# Patient Record
Sex: Female | Born: 1935 | Race: White | Hispanic: No | Smoking: Former smoker
Health system: Southern US, Community
[De-identification: ages and names within clinical notes are randomized; demographics above are authoritative.]

## PROBLEM LIST (undated history)

## (undated) DIAGNOSIS — M8580 Other specified disorders of bone density and structure, unspecified site: Secondary | ICD-10-CM

## (undated) DIAGNOSIS — F329 Major depressive disorder, single episode, unspecified: Secondary | ICD-10-CM

## (undated) DIAGNOSIS — F0390 Unspecified dementia without behavioral disturbance: Secondary | ICD-10-CM

## (undated) DIAGNOSIS — F32A Depression, unspecified: Secondary | ICD-10-CM

## (undated) DIAGNOSIS — E039 Hypothyroidism, unspecified: Secondary | ICD-10-CM

## (undated) DIAGNOSIS — C50919 Malignant neoplasm of unspecified site of unspecified female breast: Secondary | ICD-10-CM

## (undated) DIAGNOSIS — I10 Essential (primary) hypertension: Secondary | ICD-10-CM

## (undated) DIAGNOSIS — R519 Headache, unspecified: Secondary | ICD-10-CM

## (undated) DIAGNOSIS — I639 Cerebral infarction, unspecified: Secondary | ICD-10-CM

## (undated) DIAGNOSIS — J189 Pneumonia, unspecified organism: Secondary | ICD-10-CM

## (undated) DIAGNOSIS — E785 Hyperlipidemia, unspecified: Secondary | ICD-10-CM

## (undated) DIAGNOSIS — C44300 Unspecified malignant neoplasm of skin of unspecified part of face: Secondary | ICD-10-CM

## (undated) DIAGNOSIS — I739 Peripheral vascular disease, unspecified: Secondary | ICD-10-CM

## (undated) DIAGNOSIS — N289 Disorder of kidney and ureter, unspecified: Secondary | ICD-10-CM

## (undated) DIAGNOSIS — R0602 Shortness of breath: Secondary | ICD-10-CM

## (undated) DIAGNOSIS — Z8719 Personal history of other diseases of the digestive system: Secondary | ICD-10-CM

## (undated) DIAGNOSIS — J449 Chronic obstructive pulmonary disease, unspecified: Secondary | ICD-10-CM

## (undated) DIAGNOSIS — M1990 Unspecified osteoarthritis, unspecified site: Secondary | ICD-10-CM

## (undated) DIAGNOSIS — N184 Chronic kidney disease, stage 4 (severe): Secondary | ICD-10-CM

## (undated) DIAGNOSIS — F419 Anxiety disorder, unspecified: Secondary | ICD-10-CM

## (undated) DIAGNOSIS — R51 Headache: Secondary | ICD-10-CM

## (undated) HISTORY — DX: Chronic obstructive pulmonary disease, unspecified: J44.9

## (undated) HISTORY — DX: Major depressive disorder, single episode, unspecified: F32.9

## (undated) HISTORY — DX: Disorder of kidney and ureter, unspecified: N28.9

## (undated) HISTORY — PX: TONSILLECTOMY: SUR1361

## (undated) HISTORY — DX: Hypothyroidism, unspecified: E03.9

## (undated) HISTORY — DX: Unspecified osteoarthritis, unspecified site: M19.90

## (undated) HISTORY — DX: Other specified disorders of bone density and structure, unspecified site: M85.80

## (undated) HISTORY — PX: ABDOMINAL HYSTERECTOMY: SHX81

## (undated) HISTORY — DX: Malignant neoplasm of unspecified site of unspecified female breast: C50.919

## (undated) HISTORY — DX: Essential (primary) hypertension: I10

## (undated) HISTORY — DX: Hyperlipidemia, unspecified: E78.5

## (undated) HISTORY — DX: Depression, unspecified: F32.A

## (undated) HISTORY — DX: Unspecified dementia, unspecified severity, without behavioral disturbance, psychotic disturbance, mood disturbance, and anxiety: F03.90

## (undated) HISTORY — PX: BREAST LUMPECTOMY: SHX2

## (undated) HISTORY — PX: APPENDECTOMY: SHX54

---

## 1989-04-09 DIAGNOSIS — Z8711 Personal history of peptic ulcer disease: Secondary | ICD-10-CM

## 1989-04-09 HISTORY — PX: REFRACTIVE SURGERY: SHX103

## 1989-04-09 HISTORY — DX: Personal history of peptic ulcer disease: Z87.11

## 1989-04-09 HISTORY — PX: SKIN CANCER EXCISION: SHX779

## 1989-04-09 HISTORY — PX: CATARACT EXTRACTION W/ INTRAOCULAR LENS  IMPLANT, BILATERAL: SHX1307

## 1999-04-10 HISTORY — PX: BILATERAL OOPHORECTOMY: SHX1221

## 2003-01-16 ENCOUNTER — Ambulatory Visit (HOSPITAL_COMMUNITY): Admission: RE | Admit: 2003-01-16 | Discharge: 2003-01-16 | Payer: Self-pay | Admitting: Nephrology

## 2004-07-09 ENCOUNTER — Emergency Department (HOSPITAL_COMMUNITY): Admission: EM | Admit: 2004-07-09 | Discharge: 2004-07-10 | Payer: Self-pay | Admitting: Emergency Medicine

## 2004-12-21 ENCOUNTER — Emergency Department (HOSPITAL_COMMUNITY): Admission: EM | Admit: 2004-12-21 | Discharge: 2004-12-21 | Payer: Self-pay | Admitting: Emergency Medicine

## 2005-01-12 ENCOUNTER — Emergency Department (HOSPITAL_COMMUNITY): Admission: EM | Admit: 2005-01-12 | Discharge: 2005-01-12 | Payer: Self-pay | Admitting: Emergency Medicine

## 2005-06-15 ENCOUNTER — Encounter: Admission: RE | Admit: 2005-06-15 | Discharge: 2005-06-15 | Payer: Self-pay | Admitting: General Surgery

## 2005-06-24 ENCOUNTER — Ambulatory Visit (HOSPITAL_COMMUNITY): Admission: RE | Admit: 2005-06-24 | Discharge: 2005-06-25 | Payer: Self-pay | Admitting: General Surgery

## 2005-06-24 ENCOUNTER — Encounter (INDEPENDENT_AMBULATORY_CARE_PROVIDER_SITE_OTHER): Payer: Self-pay | Admitting: *Deleted

## 2005-07-13 ENCOUNTER — Ambulatory Visit: Payer: Self-pay | Admitting: Oncology

## 2005-08-03 ENCOUNTER — Ambulatory Visit (HOSPITAL_COMMUNITY): Admission: RE | Admit: 2005-08-03 | Discharge: 2005-08-03 | Payer: Self-pay | Admitting: Oncology

## 2005-08-05 ENCOUNTER — Ambulatory Visit: Admission: RE | Admit: 2005-08-05 | Discharge: 2005-09-16 | Payer: Self-pay | Admitting: Radiation Oncology

## 2005-08-05 ENCOUNTER — Encounter: Admission: RE | Admit: 2005-08-05 | Discharge: 2005-08-05 | Payer: Self-pay | Admitting: Oncology

## 2005-09-09 ENCOUNTER — Ambulatory Visit: Payer: Self-pay | Admitting: Oncology

## 2005-12-08 ENCOUNTER — Ambulatory Visit: Payer: Self-pay | Admitting: Oncology

## 2005-12-10 LAB — COMPREHENSIVE METABOLIC PANEL
Albumin: 3.8 g/dL (ref 3.5–5.2)
Alkaline Phosphatase: 75 U/L (ref 39–117)
CO2: 27 mEq/L (ref 19–32)
Chloride: 105 mEq/L (ref 96–112)
Glucose, Bld: 74 mg/dL (ref 70–99)
Potassium: 4.2 mEq/L (ref 3.5–5.3)
Sodium: 141 mEq/L (ref 135–145)
Total Protein: 6.5 g/dL (ref 6.0–8.3)

## 2005-12-10 LAB — CBC WITH DIFFERENTIAL/PLATELET
Eosinophils Absolute: 0.6 10*3/uL — ABNORMAL HIGH (ref 0.0–0.5)
MONO#: 1 10*3/uL — ABNORMAL HIGH (ref 0.1–0.9)
NEUT#: 4.4 10*3/uL (ref 1.5–6.5)
RBC: 4.67 10*6/uL (ref 3.70–5.32)
RDW: 15.7 % — ABNORMAL HIGH (ref 11.3–14.5)
WBC: 8.2 10*3/uL (ref 3.9–10.0)
lymph#: 2.1 10*3/uL (ref 0.9–3.3)

## 2006-03-23 ENCOUNTER — Ambulatory Visit: Payer: Self-pay | Admitting: Family Medicine

## 2006-03-30 ENCOUNTER — Ambulatory Visit: Payer: Self-pay | Admitting: Family Medicine

## 2006-03-31 ENCOUNTER — Encounter: Admission: RE | Admit: 2006-03-31 | Discharge: 2006-03-31 | Payer: Self-pay | Admitting: Family Medicine

## 2006-04-06 ENCOUNTER — Ambulatory Visit: Payer: Self-pay | Admitting: Oncology

## 2006-05-03 ENCOUNTER — Ambulatory Visit: Payer: Self-pay | Admitting: Family Medicine

## 2006-09-07 DIAGNOSIS — J449 Chronic obstructive pulmonary disease, unspecified: Secondary | ICD-10-CM | POA: Insufficient documentation

## 2006-09-07 DIAGNOSIS — J4489 Other specified chronic obstructive pulmonary disease: Secondary | ICD-10-CM | POA: Insufficient documentation

## 2006-09-07 DIAGNOSIS — I10 Essential (primary) hypertension: Secondary | ICD-10-CM | POA: Insufficient documentation

## 2006-09-07 DIAGNOSIS — F068 Other specified mental disorders due to known physiological condition: Secondary | ICD-10-CM | POA: Insufficient documentation

## 2006-09-07 DIAGNOSIS — E785 Hyperlipidemia, unspecified: Secondary | ICD-10-CM

## 2006-09-15 ENCOUNTER — Ambulatory Visit: Payer: Self-pay | Admitting: Internal Medicine

## 2006-09-15 LAB — CONVERTED CEMR LAB
BUN: 31 mg/dL — ABNORMAL HIGH (ref 6–23)
Basophils Relative: 1 % (ref 0.0–1.0)
CO2: 26 meq/L (ref 19–32)
Calcium: 9.8 mg/dL (ref 8.4–10.5)
Eosinophils Absolute: 0.5 10*3/uL (ref 0.0–0.6)
GFR calc Af Amer: 35 mL/min
GFR calc non Af Amer: 29 mL/min
Lymphocytes Relative: 19.5 % (ref 12.0–46.0)
Monocytes Relative: 10 % (ref 3.0–11.0)
Neutro Abs: 6.4 10*3/uL (ref 1.4–7.7)
Platelets: 220 10*3/uL (ref 150–400)

## 2006-09-30 ENCOUNTER — Ambulatory Visit: Payer: Self-pay | Admitting: Internal Medicine

## 2006-10-11 ENCOUNTER — Encounter: Payer: Self-pay | Admitting: Internal Medicine

## 2006-11-08 ENCOUNTER — Ambulatory Visit: Payer: Self-pay | Admitting: Oncology

## 2006-11-11 LAB — COMPREHENSIVE METABOLIC PANEL
ALT: 13 U/L (ref 0–35)
Albumin: 4.1 g/dL (ref 3.5–5.2)
CO2: 26 mEq/L (ref 19–32)
Glucose, Bld: 74 mg/dL (ref 70–99)
Potassium: 4.4 mEq/L (ref 3.5–5.3)
Sodium: 143 mEq/L (ref 135–145)
Total Bilirubin: 0.4 mg/dL (ref 0.3–1.2)
Total Protein: 6.9 g/dL (ref 6.0–8.3)

## 2006-11-11 LAB — CANCER ANTIGEN 27.29: CA 27.29: 39 U/mL (ref 0–39)

## 2006-11-11 LAB — CBC WITH DIFFERENTIAL/PLATELET
BASO%: 1 % (ref 0.0–2.0)
Eosinophils Absolute: 0.5 10*3/uL (ref 0.0–0.5)
LYMPH%: 19.5 % (ref 14.0–48.0)
MCHC: 34.7 g/dL (ref 32.0–36.0)
MONO#: 1 10*3/uL — ABNORMAL HIGH (ref 0.1–0.9)
NEUT#: 5.5 10*3/uL (ref 1.5–6.5)
RBC: 4.6 10*6/uL (ref 3.70–5.32)
RDW: 15.3 % — ABNORMAL HIGH (ref 11.3–14.5)
WBC: 8.8 10*3/uL (ref 3.9–10.0)
lymph#: 1.7 10*3/uL (ref 0.9–3.3)

## 2006-11-11 LAB — LACTATE DEHYDROGENASE: LDH: 156 U/L (ref 94–250)

## 2007-02-03 ENCOUNTER — Encounter (INDEPENDENT_AMBULATORY_CARE_PROVIDER_SITE_OTHER): Payer: Self-pay | Admitting: Family Medicine

## 2008-12-27 ENCOUNTER — Encounter: Payer: Self-pay | Admitting: Internal Medicine

## 2008-12-30 ENCOUNTER — Ambulatory Visit: Payer: Self-pay | Admitting: Internal Medicine

## 2008-12-30 DIAGNOSIS — N259 Disorder resulting from impaired renal tubular function, unspecified: Secondary | ICD-10-CM | POA: Insufficient documentation

## 2008-12-30 DIAGNOSIS — E0590 Thyrotoxicosis, unspecified without thyrotoxic crisis or storm: Secondary | ICD-10-CM | POA: Insufficient documentation

## 2008-12-30 DIAGNOSIS — Z853 Personal history of malignant neoplasm of breast: Secondary | ICD-10-CM

## 2009-01-01 ENCOUNTER — Encounter: Payer: Self-pay | Admitting: Internal Medicine

## 2009-01-07 LAB — CONVERTED CEMR LAB
ALT: 16 units/L (ref 0–35)
Eosinophils Relative: 4.3 % (ref 0.0–5.0)
GFR calc non Af Amer: 28.47 mL/min (ref >60)
HCT: 41 % (ref 36.0–46.0)
HDL: 41.6 mg/dL (ref >39.00)
Hemoglobin: 14.3 g/dL (ref 12.0–15.0)
Lymphs Abs: 2.1 10*3/uL (ref 0.7–4.0)
Monocytes Relative: 9.7 % (ref 3.0–12.0)
Neutro Abs: 5.9 10*3/uL (ref 1.4–7.7)
Potassium: 4 meq/L (ref 3.5–5.1)
RBC: 4.69 M/uL (ref 3.87–5.11)
Sodium: 144 meq/L (ref 135–145)
TSH: 0.8 microintl units/mL (ref 0.35–5.50)
Total CHOL/HDL Ratio: 6
Triglycerides: 121 mg/dL (ref 0.0–149.0)
WBC: 9.3 10*3/uL (ref 4.5–10.5)

## 2009-01-08 ENCOUNTER — Encounter: Payer: Self-pay | Admitting: Internal Medicine

## 2009-01-09 ENCOUNTER — Encounter: Payer: Self-pay | Admitting: Internal Medicine

## 2009-01-13 ENCOUNTER — Encounter (INDEPENDENT_AMBULATORY_CARE_PROVIDER_SITE_OTHER): Payer: Self-pay | Admitting: *Deleted

## 2009-01-13 ENCOUNTER — Encounter: Payer: Self-pay | Admitting: Internal Medicine

## 2009-01-15 ENCOUNTER — Encounter: Payer: Self-pay | Admitting: Internal Medicine

## 2009-01-16 ENCOUNTER — Encounter: Payer: Self-pay | Admitting: Internal Medicine

## 2009-01-29 ENCOUNTER — Encounter: Payer: Self-pay | Admitting: Internal Medicine

## 2009-02-04 ENCOUNTER — Encounter: Payer: Self-pay | Admitting: Internal Medicine

## 2009-02-18 ENCOUNTER — Ambulatory Visit: Payer: Self-pay | Admitting: Internal Medicine

## 2009-02-18 DIAGNOSIS — M1990 Unspecified osteoarthritis, unspecified site: Secondary | ICD-10-CM | POA: Insufficient documentation

## 2009-02-18 DIAGNOSIS — M899 Disorder of bone, unspecified: Secondary | ICD-10-CM | POA: Insufficient documentation

## 2009-02-18 DIAGNOSIS — M949 Disorder of cartilage, unspecified: Secondary | ICD-10-CM

## 2009-02-18 DIAGNOSIS — R269 Unspecified abnormalities of gait and mobility: Secondary | ICD-10-CM

## 2009-02-18 LAB — CONVERTED CEMR LAB
Ketones, urine, test strip: NEGATIVE
Nitrite: NEGATIVE
Urobilinogen, UA: 0.2
pH: 5

## 2009-02-20 ENCOUNTER — Encounter: Payer: Self-pay | Admitting: Internal Medicine

## 2009-02-21 ENCOUNTER — Encounter: Payer: Self-pay | Admitting: Internal Medicine

## 2009-02-24 ENCOUNTER — Encounter: Admission: RE | Admit: 2009-02-24 | Discharge: 2009-04-10 | Payer: Self-pay | Admitting: Internal Medicine

## 2009-02-24 ENCOUNTER — Ambulatory Visit: Payer: Self-pay | Admitting: Internal Medicine

## 2009-02-24 ENCOUNTER — Telehealth (INDEPENDENT_AMBULATORY_CARE_PROVIDER_SITE_OTHER): Payer: Self-pay | Admitting: *Deleted

## 2009-02-24 ENCOUNTER — Encounter: Payer: Self-pay | Admitting: Family Medicine

## 2009-02-25 ENCOUNTER — Telehealth (INDEPENDENT_AMBULATORY_CARE_PROVIDER_SITE_OTHER): Payer: Self-pay | Admitting: *Deleted

## 2009-03-05 ENCOUNTER — Encounter: Payer: Self-pay | Admitting: Internal Medicine

## 2009-03-12 ENCOUNTER — Telehealth: Payer: Self-pay | Admitting: Internal Medicine

## 2009-03-17 ENCOUNTER — Telehealth: Payer: Self-pay | Admitting: Internal Medicine

## 2009-03-28 ENCOUNTER — Encounter: Payer: Self-pay | Admitting: Internal Medicine

## 2009-04-02 ENCOUNTER — Telehealth: Payer: Self-pay | Admitting: Internal Medicine

## 2009-04-02 ENCOUNTER — Encounter: Payer: Self-pay | Admitting: Internal Medicine

## 2009-04-16 ENCOUNTER — Telehealth: Payer: Self-pay | Admitting: Internal Medicine

## 2009-04-17 ENCOUNTER — Ambulatory Visit: Payer: Self-pay | Admitting: Internal Medicine

## 2009-04-18 ENCOUNTER — Telehealth: Payer: Self-pay | Admitting: Internal Medicine

## 2009-04-21 ENCOUNTER — Encounter: Payer: Self-pay | Admitting: Internal Medicine

## 2009-04-23 LAB — CONVERTED CEMR LAB: TSH: 0.79 microintl units/mL (ref 0.35–5.50)

## 2009-04-29 ENCOUNTER — Encounter: Payer: Self-pay | Admitting: Internal Medicine

## 2009-05-20 ENCOUNTER — Telehealth: Payer: Self-pay | Admitting: Internal Medicine

## 2009-05-21 ENCOUNTER — Encounter: Payer: Self-pay | Admitting: Internal Medicine

## 2009-06-20 ENCOUNTER — Ambulatory Visit: Payer: Self-pay | Admitting: Family Medicine

## 2009-06-26 ENCOUNTER — Encounter: Payer: Self-pay | Admitting: Family Medicine

## 2009-06-27 ENCOUNTER — Ambulatory Visit: Payer: Self-pay | Admitting: Family Medicine

## 2009-06-27 ENCOUNTER — Telehealth: Payer: Self-pay | Admitting: Internal Medicine

## 2009-06-27 DIAGNOSIS — N390 Urinary tract infection, site not specified: Secondary | ICD-10-CM

## 2009-06-27 LAB — CONVERTED CEMR LAB
Bilirubin Urine: NEGATIVE
Hemoglobin: 13.2 g/dL
Ketones, urine, test strip: NEGATIVE
Nitrite: POSITIVE
Specific Gravity, Urine: 1.015
pH: 6

## 2009-07-01 ENCOUNTER — Telehealth (INDEPENDENT_AMBULATORY_CARE_PROVIDER_SITE_OTHER): Payer: Self-pay | Admitting: *Deleted

## 2009-07-17 ENCOUNTER — Emergency Department (HOSPITAL_BASED_OUTPATIENT_CLINIC_OR_DEPARTMENT_OTHER): Admission: EM | Admit: 2009-07-17 | Discharge: 2009-07-17 | Payer: Self-pay | Admitting: Emergency Medicine

## 2009-07-17 ENCOUNTER — Telehealth: Payer: Self-pay | Admitting: Internal Medicine

## 2009-07-17 ENCOUNTER — Ambulatory Visit: Payer: Self-pay | Admitting: Diagnostic Radiology

## 2009-07-18 ENCOUNTER — Encounter (INDEPENDENT_AMBULATORY_CARE_PROVIDER_SITE_OTHER): Payer: Self-pay | Admitting: *Deleted

## 2009-07-23 ENCOUNTER — Telehealth: Payer: Self-pay | Admitting: Internal Medicine

## 2009-07-29 ENCOUNTER — Encounter: Admission: RE | Admit: 2009-07-29 | Discharge: 2009-07-29 | Payer: Self-pay | Admitting: Internal Medicine

## 2009-07-31 ENCOUNTER — Telehealth (INDEPENDENT_AMBULATORY_CARE_PROVIDER_SITE_OTHER): Payer: Self-pay | Admitting: *Deleted

## 2009-08-06 ENCOUNTER — Encounter (INDEPENDENT_AMBULATORY_CARE_PROVIDER_SITE_OTHER): Payer: Self-pay | Admitting: *Deleted

## 2009-08-06 LAB — CONVERTED CEMR LAB
Bilirubin Urine: NEGATIVE
Glucose, Urine, Semiquant: NEGATIVE
Protein, U semiquant: NEGATIVE
Specific Gravity, Urine: 1.01
pH: 5

## 2009-08-07 ENCOUNTER — Encounter: Payer: Self-pay | Admitting: Internal Medicine

## 2009-08-07 LAB — CONVERTED CEMR LAB
Casts: NONE SEEN /lpf
Crystals: NONE SEEN
Squamous Epithelial / LPF: NONE SEEN /lpf

## 2009-08-11 ENCOUNTER — Emergency Department (HOSPITAL_BASED_OUTPATIENT_CLINIC_OR_DEPARTMENT_OTHER): Admission: EM | Admit: 2009-08-11 | Discharge: 2009-08-11 | Payer: Self-pay | Admitting: Emergency Medicine

## 2009-08-11 ENCOUNTER — Telehealth: Payer: Self-pay | Admitting: Internal Medicine

## 2009-08-11 ENCOUNTER — Encounter: Payer: Self-pay | Admitting: Internal Medicine

## 2009-08-11 ENCOUNTER — Ambulatory Visit: Payer: Self-pay | Admitting: Diagnostic Radiology

## 2009-08-13 ENCOUNTER — Encounter: Payer: Self-pay | Admitting: Internal Medicine

## 2009-08-22 ENCOUNTER — Ambulatory Visit: Payer: Self-pay | Admitting: Internal Medicine

## 2009-08-25 ENCOUNTER — Ambulatory Visit: Payer: Self-pay | Admitting: Internal Medicine

## 2009-08-26 ENCOUNTER — Encounter: Payer: Self-pay | Admitting: Internal Medicine

## 2009-08-26 LAB — CONVERTED CEMR LAB
Bacteria, UA: NONE SEEN
Casts: NONE SEEN /lpf
RBC / HPF: NONE SEEN (ref <3)

## 2009-08-28 ENCOUNTER — Telehealth: Payer: Self-pay | Admitting: Internal Medicine

## 2009-09-19 ENCOUNTER — Encounter: Payer: Self-pay | Admitting: Internal Medicine

## 2009-09-22 ENCOUNTER — Telehealth: Payer: Self-pay | Admitting: Internal Medicine

## 2009-09-24 ENCOUNTER — Ambulatory Visit: Payer: Self-pay | Admitting: Internal Medicine

## 2009-09-25 ENCOUNTER — Encounter: Payer: Self-pay | Admitting: Internal Medicine

## 2009-10-06 ENCOUNTER — Encounter: Payer: Self-pay | Admitting: Internal Medicine

## 2009-10-24 ENCOUNTER — Encounter: Admission: RE | Admit: 2009-10-24 | Discharge: 2009-10-24 | Payer: Self-pay | Admitting: Neurology

## 2009-10-28 ENCOUNTER — Telehealth: Payer: Self-pay | Admitting: Internal Medicine

## 2009-11-14 ENCOUNTER — Telehealth: Payer: Self-pay | Admitting: Internal Medicine

## 2009-11-17 ENCOUNTER — Encounter: Payer: Self-pay | Admitting: Internal Medicine

## 2009-11-24 ENCOUNTER — Encounter: Payer: Self-pay | Admitting: Internal Medicine

## 2009-12-22 ENCOUNTER — Encounter: Payer: Self-pay | Admitting: Internal Medicine

## 2009-12-24 ENCOUNTER — Encounter: Payer: Self-pay | Admitting: Internal Medicine

## 2010-01-02 ENCOUNTER — Encounter: Payer: Self-pay | Admitting: Internal Medicine

## 2010-01-15 ENCOUNTER — Ambulatory Visit: Payer: Self-pay | Admitting: Internal Medicine

## 2010-01-15 DIAGNOSIS — L989 Disorder of the skin and subcutaneous tissue, unspecified: Secondary | ICD-10-CM | POA: Insufficient documentation

## 2010-01-15 LAB — CONVERTED CEMR LAB
Basophils Absolute: 0.1 10*3/uL (ref 0.0–0.1)
Bilirubin Urine: NEGATIVE
Calcium: 10 mg/dL (ref 8.4–10.5)
Creatinine, Ser: 1.7 mg/dL — ABNORMAL HIGH (ref 0.4–1.2)
Eosinophils Absolute: 1 10*3/uL — ABNORMAL HIGH (ref 0.0–0.7)
GFR calc non Af Amer: 29.92 mL/min (ref >60)
Glucose, Urine, Semiquant: NEGATIVE
Hemoglobin: 14.1 g/dL (ref 12.0–15.0)
Lymphocytes Relative: 16.9 % (ref 12.0–46.0)
MCHC: 34.2 g/dL (ref 30.0–36.0)
Monocytes Relative: 10.7 % (ref 3.0–12.0)
Neutro Abs: 6.4 10*3/uL (ref 1.4–7.7)
Neutrophils Relative %: 61.9 % (ref 43.0–77.0)
RBC: 4.73 M/uL (ref 3.87–5.11)
RDW: 15.1 % — ABNORMAL HIGH (ref 11.5–14.6)
Sodium: 143 meq/L (ref 135–145)
Specific Gravity, Urine: 1.01
Vit D, 25-Hydroxy: 26 ng/mL — ABNORMAL LOW (ref 30–89)
pH: 6

## 2010-01-16 ENCOUNTER — Encounter: Payer: Self-pay | Admitting: Internal Medicine

## 2010-01-18 ENCOUNTER — Telehealth (INDEPENDENT_AMBULATORY_CARE_PROVIDER_SITE_OTHER): Payer: Self-pay | Admitting: *Deleted

## 2010-01-26 ENCOUNTER — Encounter: Payer: Self-pay | Admitting: Internal Medicine

## 2010-02-12 ENCOUNTER — Encounter: Payer: Self-pay | Admitting: Internal Medicine

## 2010-02-26 ENCOUNTER — Encounter: Payer: Self-pay | Admitting: Internal Medicine

## 2010-03-11 ENCOUNTER — Encounter: Payer: Self-pay | Admitting: Internal Medicine

## 2010-03-12 ENCOUNTER — Encounter: Payer: Self-pay | Admitting: Internal Medicine

## 2010-03-12 ENCOUNTER — Telehealth (INDEPENDENT_AMBULATORY_CARE_PROVIDER_SITE_OTHER): Payer: Self-pay | Admitting: *Deleted

## 2010-04-03 ENCOUNTER — Ambulatory Visit: Payer: Self-pay | Admitting: Internal Medicine

## 2010-04-03 LAB — CONVERTED CEMR LAB
Bilirubin Urine: NEGATIVE
Glucose, Urine, Semiquant: NEGATIVE
Protein, U semiquant: 30
Specific Gravity, Urine: 1.02
pH: 5

## 2010-04-04 ENCOUNTER — Encounter: Payer: Self-pay | Admitting: Internal Medicine

## 2010-04-04 LAB — CONVERTED CEMR LAB
Casts: NONE SEEN /lpf
Crystals: NONE SEEN

## 2010-04-06 ENCOUNTER — Telehealth: Payer: Self-pay | Admitting: Internal Medicine

## 2010-04-06 LAB — CONVERTED CEMR LAB
Basophils Absolute: 0.1 10*3/uL (ref 0.0–0.1)
Eosinophils Relative: 10 % — ABNORMAL HIGH (ref 0–5)
Lymphocytes Relative: 21 % (ref 12–46)
Lymphs Abs: 2.6 10*3/uL (ref 0.7–4.0)
Neutro Abs: 7.1 10*3/uL (ref 1.7–7.7)
Neutrophils Relative %: 58 % (ref 43–77)
Platelets: 188 10*3/uL (ref 150–400)
RDW: 15.9 % — ABNORMAL HIGH (ref 11.5–15.5)
TSH: 6.478 microintl units/mL — ABNORMAL HIGH (ref 0.350–4.500)
WBC: 12.3 10*3/uL — ABNORMAL HIGH (ref 4.0–10.5)

## 2010-04-10 ENCOUNTER — Encounter: Payer: Self-pay | Admitting: Internal Medicine

## 2010-04-14 ENCOUNTER — Encounter: Payer: Self-pay | Admitting: Internal Medicine

## 2010-04-17 ENCOUNTER — Encounter: Payer: Self-pay | Admitting: Internal Medicine

## 2010-04-19 ENCOUNTER — Encounter: Payer: Self-pay | Admitting: Internal Medicine

## 2010-04-21 ENCOUNTER — Ambulatory Visit: Payer: Self-pay | Admitting: Diagnostic Radiology

## 2010-04-21 ENCOUNTER — Emergency Department (HOSPITAL_BASED_OUTPATIENT_CLINIC_OR_DEPARTMENT_OTHER): Admission: EM | Admit: 2010-04-21 | Discharge: 2010-04-21 | Payer: Self-pay | Admitting: Emergency Medicine

## 2010-04-21 ENCOUNTER — Telehealth: Payer: Self-pay | Admitting: Internal Medicine

## 2010-04-22 ENCOUNTER — Ambulatory Visit: Payer: Self-pay | Admitting: Internal Medicine

## 2010-04-22 DIAGNOSIS — F329 Major depressive disorder, single episode, unspecified: Secondary | ICD-10-CM

## 2010-04-24 ENCOUNTER — Encounter: Payer: Self-pay | Admitting: Internal Medicine

## 2010-04-24 ENCOUNTER — Telehealth (INDEPENDENT_AMBULATORY_CARE_PROVIDER_SITE_OTHER): Payer: Self-pay | Admitting: *Deleted

## 2010-05-05 ENCOUNTER — Ambulatory Visit: Payer: Self-pay | Admitting: Internal Medicine

## 2010-05-07 ENCOUNTER — Encounter: Payer: Self-pay | Admitting: Internal Medicine

## 2010-05-11 ENCOUNTER — Encounter: Payer: Self-pay | Admitting: Internal Medicine

## 2010-05-11 LAB — CONVERTED CEMR LAB: TSH: 1.36 microintl units/mL (ref 0.35–5.50)

## 2010-05-14 ENCOUNTER — Telehealth: Payer: Self-pay | Admitting: Internal Medicine

## 2010-05-15 ENCOUNTER — Encounter: Payer: Self-pay | Admitting: Internal Medicine

## 2010-05-15 ENCOUNTER — Telehealth (INDEPENDENT_AMBULATORY_CARE_PROVIDER_SITE_OTHER): Payer: Self-pay | Admitting: *Deleted

## 2010-05-19 ENCOUNTER — Encounter: Payer: Self-pay | Admitting: Internal Medicine

## 2010-05-20 ENCOUNTER — Telehealth: Payer: Self-pay | Admitting: Internal Medicine

## 2010-05-21 ENCOUNTER — Telehealth (INDEPENDENT_AMBULATORY_CARE_PROVIDER_SITE_OTHER): Payer: Self-pay | Admitting: *Deleted

## 2010-05-21 ENCOUNTER — Telehealth: Payer: Self-pay | Admitting: Internal Medicine

## 2010-05-22 ENCOUNTER — Encounter: Payer: Self-pay | Admitting: Internal Medicine

## 2010-05-26 ENCOUNTER — Encounter: Payer: Self-pay | Admitting: Internal Medicine

## 2010-06-02 ENCOUNTER — Encounter: Payer: Self-pay | Admitting: Internal Medicine

## 2010-06-03 ENCOUNTER — Telehealth: Payer: Self-pay | Admitting: Internal Medicine

## 2010-06-08 ENCOUNTER — Encounter: Payer: Self-pay | Admitting: Internal Medicine

## 2010-06-10 ENCOUNTER — Telehealth (INDEPENDENT_AMBULATORY_CARE_PROVIDER_SITE_OTHER): Payer: Self-pay | Admitting: *Deleted

## 2010-06-15 ENCOUNTER — Encounter: Payer: Self-pay | Admitting: Internal Medicine

## 2010-06-28 ENCOUNTER — Encounter: Payer: Self-pay | Admitting: Internal Medicine

## 2010-07-28 ENCOUNTER — Telehealth: Payer: Self-pay | Admitting: Internal Medicine

## 2010-08-07 ENCOUNTER — Telehealth: Payer: Self-pay | Admitting: Internal Medicine

## 2010-08-11 ENCOUNTER — Ambulatory Visit
Admission: RE | Admit: 2010-08-11 | Discharge: 2010-08-11 | Payer: Self-pay | Source: Home / Self Care | Attending: Internal Medicine | Admitting: Internal Medicine

## 2010-08-13 ENCOUNTER — Inpatient Hospital Stay (HOSPITAL_COMMUNITY)
Admission: EM | Admit: 2010-08-13 | Discharge: 2010-08-17 | Payer: Self-pay | Attending: Internal Medicine | Admitting: Internal Medicine

## 2010-08-13 ENCOUNTER — Emergency Department (HOSPITAL_BASED_OUTPATIENT_CLINIC_OR_DEPARTMENT_OTHER)
Admission: EM | Admit: 2010-08-13 | Discharge: 2010-08-13 | Disposition: A | Discharge status: Other Acute Inpatient Hospital | Payer: Self-pay | Source: Home / Self Care | Admitting: Emergency Medicine

## 2010-08-13 LAB — URINALYSIS, ROUTINE W REFLEX MICROSCOPIC
Bilirubin Urine: NEGATIVE
Ketones, ur: NEGATIVE mg/dL
Nitrite: POSITIVE — AB
Protein, ur: 30 mg/dL — AB
Specific Gravity, Urine: 1.021 (ref 1.005–1.030)
Urine Glucose, Fasting: NEGATIVE mg/dL
Urobilinogen, UA: 0.2 mg/dL (ref 0.0–1.0)
pH: 6 (ref 5.0–8.0)

## 2010-08-13 LAB — BASIC METABOLIC PANEL
BUN: 32 mg/dL — ABNORMAL HIGH (ref 6–23)
CO2: 26 mEq/L (ref 19–32)
Calcium: 9.9 mg/dL (ref 8.4–10.5)
Chloride: 104 mEq/L (ref 96–112)
Creatinine, Ser: 2 mg/dL — ABNORMAL HIGH (ref 0.4–1.2)
GFR calc Af Amer: 29 mL/min — ABNORMAL LOW (ref >60)
GFR calc non Af Amer: 24 mL/min — ABNORMAL LOW (ref >60)
Glucose, Bld: 112 mg/dL — ABNORMAL HIGH (ref 70–99)
Potassium: 3.2 mEq/L — ABNORMAL LOW (ref 3.5–5.1)
Sodium: 145 mEq/L (ref 135–145)

## 2010-08-13 LAB — URINE MICROSCOPIC-ADD ON

## 2010-08-13 LAB — DIFFERENTIAL
Basophils Absolute: 0 10*3/uL (ref 0.0–0.1)
Basophils Relative: 0 % (ref 0–1)
Eosinophils Absolute: 0.7 10*3/uL (ref 0.0–0.7)
Eosinophils Relative: 6 % — ABNORMAL HIGH (ref 0–5)
Lymphocytes Relative: 10 % — ABNORMAL LOW (ref 12–46)
Lymphs Abs: 1.2 10*3/uL (ref 0.7–4.0)
Monocytes Absolute: 0.9 10*3/uL (ref 0.1–1.0)
Monocytes Relative: 8 % (ref 3–12)
Neutro Abs: 9.2 10*3/uL — ABNORMAL HIGH (ref 1.7–7.7)
Neutrophils Relative %: 76 % (ref 43–77)

## 2010-08-13 LAB — CBC
HCT: 43.9 % (ref 36.0–46.0)
Hemoglobin: 14.6 g/dL (ref 12.0–15.0)
MCH: 28 pg (ref 26.0–34.0)
MCHC: 33.3 g/dL (ref 30.0–36.0)
MCV: 84.1 fL (ref 78.0–100.0)
Platelets: 198 10*3/uL (ref 150–400)
RBC: 5.22 MIL/uL — ABNORMAL HIGH (ref 3.87–5.11)
RDW: 14.3 % (ref 11.5–15.5)
WBC: 12.2 10*3/uL — ABNORMAL HIGH (ref 4.0–10.5)

## 2010-08-13 LAB — APTT: aPTT: 26 seconds (ref 24–37)

## 2010-08-13 LAB — PROTIME-INR
INR: 0.99 (ref 0.00–1.49)
Prothrombin Time: 13.3 seconds (ref 11.6–15.2)

## 2010-08-14 ENCOUNTER — Encounter: Payer: Self-pay | Admitting: Internal Medicine

## 2010-08-14 LAB — BASIC METABOLIC PANEL
BUN: 22 mg/dL (ref 6–23)
CO2: 25 mEq/L (ref 19–32)
Calcium: 9.2 mg/dL (ref 8.4–10.5)
Chloride: 106 mEq/L (ref 96–112)
Creatinine, Ser: 1.82 mg/dL — ABNORMAL HIGH (ref 0.4–1.2)
GFR calc Af Amer: 32 mL/min — ABNORMAL LOW (ref >60)
GFR calc non Af Amer: 26 mL/min — ABNORMAL LOW (ref >60)
Glucose, Bld: 89 mg/dL (ref 70–99)
Potassium: 3.4 mEq/L — ABNORMAL LOW (ref 3.5–5.1)
Sodium: 142 mEq/L (ref 135–145)

## 2010-08-14 LAB — CBC
HCT: 37.9 % (ref 36.0–46.0)
Hemoglobin: 12.1 g/dL (ref 12.0–15.0)
MCH: 28.2 pg (ref 26.0–34.0)
MCHC: 31.9 g/dL (ref 30.0–36.0)
MCV: 88.3 fL (ref 78.0–100.0)
Platelets: 187 10*3/uL (ref 150–400)
RBC: 4.29 MIL/uL (ref 3.87–5.11)
RDW: 14.3 % (ref 11.5–15.5)
WBC: 9.9 10*3/uL (ref 4.0–10.5)

## 2010-08-24 LAB — URINE CULTURE
Colony Count: >=100000
Culture  Setup Time: 201201060140

## 2010-08-29 ENCOUNTER — Encounter: Payer: Self-pay | Admitting: Oncology

## 2010-09-08 NOTE — Miscellaneous (Signed)
Summary: Robitussin & Diet Order/[facility]  Robitussin & Diet Order/[facility]   Imported By: Lanelle Bal 04/21/2010 08:15:32  _____________________________________________________________________  External Attachment:    Type:   Image     Comment:   External Document

## 2010-09-08 NOTE — Assessment & Plan Note (Signed)
Summary: F/UP FOR BLADDER INF; CHECK DIZZINESS FROM FALL--OK PER STACI...   Vital Signs:  Patient profile:   77 year old female Weight:      125 pounds Temp:     96.9 degrees F oral Pulse rate:   99 / minute BP sitting:   100 / 60  Vitals Entered By: Kandice Hams (August 22, 2009 4:09 PM) CC: followup from fall, per daughter mother is still dizzy.  Pt unable to void in the lab no specimen left   History of Present Illness: few days ago she tripped on the carpet and fail no LOC , no syncopal spell. she went to the [facility] she was eval and  released she was diagnosed with a UTI as well  hospital notes are reviewed from January 3. CT of the head with no acute changes, Scattered small diploic space lucencies in the skull, stable - myeloma cannot be excluded. pelvic x-rays with no acute changes urine culture was positive for     ENTEROBACTER CLOACAE, was prescribed Kelex,  the urine culture showed that there is resistance to CEFAZOLIN and Microbid  since that time:  she still has dizziness, mild, sx  better if stays quiet Denies dizziness before the fall. over all she is getting stronger    Allergies: 1)  ! Augmentin 2)  ! Sulfa  Past History:  Past Medical History: Reviewed history from 02/18/2009 and no changes required. COPD Hyperlipidemia Hypertension Dementia  Renal insufficiency  (creat 2008 was 1.8) Breast cancer, hx of Hyperthyroidism Osteopenia, last dexa aprox 2008 (per daugher) Osteoarthritis  Past Surgical History: Reviewed history from 12/30/2008 and no changes required. Appendectomy Hysterectomy Tonsillectomy  Social History: Reviewed history from 12/30/2008 and no changes required. moved back to [place] 12-2008 be moving [facility] (ALF)  tobacco-- quit years ago  ETOH-- socially   Review of Systems       denies neck pain or headaches no sore speech noted by the family appetite is good denies fever, dysuria, hematuria  Physical  Exam  General:  alert and well-developed.   Head:  several bruises in the face and forehead Lungs:  Normal respiratory effort, chest expands symmetrically. Lungs are clear to auscultation, no crackles or wheezes. Heart:  normal rate, regular rhythm, and no murmur.   Extremities:  no pretibial edema bilaterally  Neurologic:  external ocular movements are intact speech is fluent memory is at baseline she seems to be doing well. Gait seems also a baseline no gross motor deficits Psych:  not anxious appearing and not depressed appearing.     Impression & Recommendations:  Problem # 1:  DIZZINESS (ICD-780.4) Assessment New data reviewed: see HPI  likely postconcussion recommend observation, see instructions Her updated medication list for this problem includes:    Zyrtec 10 Mg Tabs (Cetirizine hcl) .Marland Kitchen... 1 by mouth at bedtime  Problem # 2:  UTI (ICD-599.0) UTI diagnosed by urine culture 08/08/1999 and again 08/11/2009 if she took Levaquin, she should be okay, see instructions  Problem # 3:  time spent 25 min due to hospital records review   Complete Medication List: 1)  Aricept 10 Mg Tabs (Donepezil hydrochloride) .Marland Kitchen.. 1 by mouth once daily 2)  Femara 2.5 Mg Tabs (Letrozole) .Marland Kitchen.. 1 by mouth once daily 3)  Synthroid 75 Mcg Tabs (Levothyroxine sodium) .Marland Kitchen.. 1 by mouth once daily 4)  Zyrtec 10 Mg Tabs (Cetirizine hcl) .Marland Kitchen.. 1 by mouth at bedtime 5)  Triamterene-hctz 37.5-25 Mg Caps (Triamterene-hctz) .Marland Kitchen.. 1 by mouth  once daily 6)  Bayer Low Strength 81 Mg Tbec (Aspirin) .Marland Kitchen.. 1 by mouth every other day 7)  Centrum Ultra Womens Tabs (Multiple vitamins-minerals) .Marland Kitchen.. 1 by mouth once daily 8)  Hectorol 0.5 Mcg Caps (Doxercalciferol) .Marland Kitchen.. 1 by mouth every m, w, f 9)  Imodium Advanced 2-125 Mg Tabs (Loperamide-simethicone) .Marland Kitchen.. 1 tab after loose stool prn - no more than 3/day 10)  Astepro 0.15 % Soln (Azelastine hcl) .Marland Kitchen.. 1 spray each nostril once daily as needed runny nose 11)  Zoloft 50 Mg  Tabs (Sertraline hcl) .Marland Kitchen.. 1 1/2 by mouth once daily 12)  Tylenol 325 Mg Tabs (Acetaminophen) .... Take two tablets every 4-6 hours as needed for pain 13)  Calcium Carbonate 1250 Mg/2ml Susp (Calcium carbonate) .... 5 ml daily 14)  Lotrisone 1-0.05 % Crea (Clotrimazole-betamethasone) .... Apply two times a day x 10 days to r buttock 15)  Aquacel Hydrofiber 4"x4" Pads (Wound dressings) .... As directed 16)  Premarin 0.625 Mg/gm Crea (Estrogens, conjugated) .... Apply cream to vaginal area m,w,f x3 months  Patient Instructions: 1)  please be sure you took levaquin 250mg  once a day for 10 days 2)  if you didn't , I'll have to prescribe it for you, let me know 3)  If you did, then please get a new urine culture and fax the results to  me 4)  if you are still dizzy in 2 weeks please notify me

## 2010-09-08 NOTE — Assessment & Plan Note (Signed)
Summary: SPOTS ON BACK ARE BLEEDING///PH   Vital Signs:  Patient profile:   77 year old female Height:      63 inches Weight:      132 pounds BMI:     23.47 Pulse rate:   72 / minute Resp:     20 per minute BP sitting:   110 / 58  (left arm)  Vitals Entered By: Jeremy Johann CMA (January 15, 2010 1:16 PM) CC: SPOTS ON PT BACK BLEEDING Comments REVIEWED MED LIST, PATIENT AGREED DOSE AND INSTRUCTION CORRECT    History of Present Illness: here with her daughter complaint of bleeding  spot  in the left upper back. Hypertension-- BP checked from time to time @ the ALF, always ok Dementia -- memory about the same , worse? Renal insufficiency  -to see nephrology soon history of a UTI, daughter  request urine to be checked Hyperthyroidism-- good medication compliance     Allergies: 1)  ! Augmentin 2)  ! Sulfa  Past History:  Past Medical History: Reviewed history from 02/18/2009 and no changes required. COPD Hyperlipidemia Hypertension Dementia  Renal insufficiency  (creat 2008 was 1.8) Breast cancer, hx of Hyperthyroidism Osteopenia, last dexa aprox 2008 (per daugher) Osteoarthritis  Past Surgical History: Reviewed history from 12/30/2008 and no changes required. Appendectomy Hysterectomy Tonsillectomy  Social History: Reviewed history from 09/24/2009 and no changes required. moved back to [place] 12-2008 lives at  [facility] (ALF)  tobacco-- quit years ago  ETOH-- socially   Review of Systems CV:  Denies chest pain or discomfort and swelling of feet. Resp:  Denies cough and shortness of breath. GI:  Denies diarrhea, nausea, and vomiting. Heme:  denies bleeding gums No blood in the stools No blood in the urine.  Physical Exam  General:  alert and well-developed.   Lungs:  Normal respiratory effort, chest expands symmetrically. Lungs are clear to auscultation, no crackles or wheezes. Heart:  normal rate, regular rhythm, and no murmur.   Extremities:  no  edema Skin:  at the upper left back she has a 1.5 cm area of redness, slight dryness and scaliness. Apparently that is the area that is bleeding the most. Inspection of the back showed no other lesions Psych:  not anxious appearing and not depressed appearing.     Impression & Recommendations:  Problem # 1:  SKIN LESION (ICD-709.9)  skin lesion in the back, skin cancer? Refer to dermatology  Orders: Dermatology Referral (Derma)  Problem # 2:  UTI (ICD-599.0) recheck a urine dip today ----> + UCX pending patient asx , will treat base on the culture   Orders: UA Dipstick w/o Micro (manual) (30160) T-Urine Culture (Spectrum Order) [phone])  Problem # 3:  HYPERTHYROIDISM (ICD-242.90) due for labs Labs Reviewed: TSH: 0.79 (04/17/2009)     Orders: Venipuncture (22025) TLB-TSH (Thyroid Stimulating Hormone) (84443-TSH)  Problem # 4:  RENAL INSUFFICIENCY (ICD-588.9) due for labs, we'll send results to nephrology  Orders: TLB-BMP (Basic Metabolic Panel-BMET) (80048-METABOL) TLB-CBC Platelet - w/Differential (85025-CBCD)  Problem # 5:  OSTEOPENIA (ICD-733.90)  Her updated medication list for this problem includes:    Hectorol 0.5 Mcg Caps (Doxercalciferol) .Marland Kitchen... 1 by mouth every m, w, f    Calcium Carbonate 1250 Mg/27ml Susp (Calcium carbonate) .Marland KitchenMarland KitchenMarland KitchenMarland Kitchen 5 ml daily  Orders: T-Vitamin D (25-Hydroxy) (42706-23762)  Complete Medication List: 1)  Aricept 10 Mg Tabs (Donepezil hydrochloride) .Marland Kitchen.. 1 by mouth once daily 2)  Femara 2.5 Mg Tabs (Letrozole) .Marland Kitchen.. 1 by mouth once  daily 3)  Synthroid 75 Mcg Tabs (Levothyroxine sodium) .Marland Kitchen.. 1 by mouth once daily 4)  Zyrtec 10 Mg Tabs (Cetirizine hcl) .Marland Kitchen.. 1 by mouth at bedtime 5)  Triamterene-hctz 37.5-25 Mg Caps (Triamterene-hctz) .Marland Kitchen.. 1 by mouth once daily 6)  Bayer Low Strength 81 Mg Tbec (Aspirin) .Marland Kitchen.. 1 by mouth every other day 7)  Centrum Ultra Womens Tabs (Multiple vitamins-minerals) .Marland Kitchen.. 1 by mouth once daily 8)  Hectorol 0.5  Mcg Caps (Doxercalciferol) .Marland Kitchen.. 1 by mouth every m, w, f 9)  Imodium Advanced 2-125 Mg Tabs (Loperamide-simethicone) .Marland Kitchen.. 1 tab after loose stool prn - no more than 3/day 10)  Astepro 0.15 % Soln (Azelastine hcl) .Marland Kitchen.. 1 spray each nostril once daily as needed runny nose 11)  Zoloft 50 Mg Tabs (Sertraline hcl) .Marland Kitchen.. 1 1/2 by mouth once daily 12)  Tylenol 325 Mg Tabs (Acetaminophen) .... Take two tablets every 4-6 hours as needed for pain 13)  Calcium Carbonate 1250 Mg/26ml Susp (Calcium carbonate) .... 5 ml daily 14)  Aquacel Hydrofiber 4"x4" Pads (Wound dressings) .... As directed  Patient Instructions: 1)  Please schedule a follow-up appointment in 4 to 5 months .   Laboratory Results   Urine Tests    Routine Urinalysis   Color: yellow Appearance: Clear Glucose: negative   (Normal Range: Negative) Bilirubin: negative   (Normal Range: Negative) Ketone: negative   (Normal Range: Negative) Spec. Gravity: 1.010   (Normal Range: 1.003-1.035) Blood: large   (Normal Range: Negative) pH: 6.0   (Normal Range: 5.0-8.0) Protein: trace   (Normal Range: Negative) Urobilinogen: 0.2   (Normal Range: 0-1) Nitrite: positive   (Normal Range: Negative) Leukocyte Esterace: large   (Normal Range: Negative)

## 2010-09-08 NOTE — Miscellaneous (Signed)
Summary: Physician Orders/[facility]  Physician Orders/[facility]   Imported By: Lanelle Bal 10/02/2009 09:12:31  _____________________________________________________________________  External Attachment:    Type:   Image     Comment:   External Document

## 2010-09-08 NOTE — Progress Notes (Signed)
Summary: problem with delivery of antibiotics  Phone Note Outgoing Call   Summary of Call: was prescribed Cipro for 10 days based on the urine culture from 04-04-10 that showed Escherichia coli repeat urine culture few days ago showed AEROCOCCUS VIRIDANS Jose E. Paz MD  May 14, 2010 10:24 AM   Follow-up for Phone Call        [facility] Facility only gave her 3 or 4 days of her antibiotic, then forgot to transfer this order to the September meds list---so she didnt get it from the last day in August until I asked about it around Sept 6-7   then they started it again and she finished the bottle of antibiotics---so she had a split dose----I will call her this afternoon and check on her symptoms Follow-up by: Jerolyn Shin,  May 14, 2010 9:58 AM  Additional Follow-up for Phone Call Additional follow up Details #1::        SPOKE TO HER AT 4:38 PM---SAID SHE WOKE UP THIS MORNING STILL SITTING ON COUCH.  SHE GOT UP AND WENT TO BEDROOM, LAY DOWN ON BED AND FELL ASLEEP UNTIL I WOKE HER AT 4:38.  SHE SAYS SHE FEELS "HORRIBLE"---I WILL GO BY HER PLACE THIS EVENING AFTER WORK.Jerolyn Shin  May 14, 2010 4:43 PM    Additional Follow-up for Phone Call Additional follow up Details #2::    I spoke with pts son in law, he states that she seems to be doing okay. Maralyn Sago went by last night.  Army Fossa CMA,  May 15, 2010 4:14 PM  [facility], if F/C, dysuria will need Re-treatment  Jose E. Paz MD  May 15, 2010 4:48 PM   Additional Follow-up for Phone Call Additional follow up Details #3:: Details for Additional Follow-up Action Taken: spoke with the patient's daughter, she is at baseline Spoke with infectious diseases, they would not treat her for AEROCOCCUS VIRIDANS ( colonization?) Will then simply observe. Will reculture if she becomes symptomatic again Galien E. Paz MD  May 18, 2010 4:57 PM

## 2010-09-08 NOTE — Progress Notes (Signed)
Summary: due for labs  Phone Note Outgoing Call   Summary of Call: advised patient's daughter: Due for a TSH and a urine culture Jose E. Paz MD  March 12, 2010 6:15 PM   Follow-up for Phone Call        DAUGHTER IS ADVISED.Marland KitchenKalyn Negrete  March 13, 2010 2:31 PM

## 2010-09-08 NOTE — Consult Note (Signed)
Summary: [facility]  [facility]   Imported By: Lanelle Bal 10/13/2009 10:02:24  _____________________________________________________________________  External Attachment:    Type:   Image     Comment:   External Document

## 2010-09-08 NOTE — Miscellaneous (Signed)
Summary: Notice of Patient Fall/[facility]  Notice of Patient Fall/[facility]   Imported By: Lanelle Bal 05/29/2010 11:03:33  _____________________________________________________________________  External Attachment:    Type:   Image     Comment:   External Document

## 2010-09-08 NOTE — Progress Notes (Signed)
Summary: Falling/ATB  Phone Note Call from Patient   Caller: Daughter Summary of Call: Pts daughter states that her mothers nurse called her from [facility] and states she fell 3x on Monday 9/12- states she didnt need a nurse. Reported falls when asking for Tylenol Monday night. She states her left lower leg and ankle hurt. [facility] is asking for an order for a mobile x-ray? Or would you rather have her go to X-ray. Which is better? They would also like an order for PT/OT evaluation and Treatment to help with walker and strength. They also did not transfer info on her ATB for UTI from 9/1-9/6. She also had 2-3 days at the end of August of Cipro then nothing. They started again on 9/6. When should she recheck Urine?  Fax orders to [phone] Initial call taken by: Army Fossa CMA,  April 21, 2010 9:07 AM  Follow-up for Phone Call        --okay mobile x-ray, left ankle, dx ankle sprain --needs a total of 10 days of Cipro, urine culture in 4 weeks --okay to refer to PT/ OT Follow-up by: Autumm Hattery E. Veronica Guerrant MD,  April 21, 2010 2:34 PM  Additional Follow-up for Phone Call Additional follow up Details #1::        Spoke with pts daughter, [facility] just ask we fax an order for all of the above.Army Fossa CMA  April 21, 2010 2:44 PM     New/Updated Medications: * 3 DIFFERENT ORDERS FOR PT --okay mobile x-ray, left ankle, dx ankle sprain-845.00 --needs a total of 10 days of Cipro,  --okay to refer to PT/ OT- 781.2 Prescriptions: 3 DIFFERENT ORDERS FOR PT --okay mobile x-ray, left ankle, dx ankle sprain-845.00 --needs a total of 10 days of Cipro,  --okay to refer to PT/ OT- 781.2  #1 x 0   Entered by:   Army Fossa CMA   Authorized by:   Nolon Rod. Mertie Haslem MD   Signed by:   Army Fossa CMA on 04/21/2010   Method used:   Print then Give to Patient   RxID:   [id number]

## 2010-09-08 NOTE — Progress Notes (Signed)
Summary: refill  Phone Note Refill Request Message from:  Fax from Pharmacy on May 15, 2010 4:57 PM  Refills Requested: Medication #1:  ZOLOFT 50 MG TABS 2 by mouth once daily [place] pharmacy - fax [phone]  Initial call taken by: Okey [facility],  May 15, 2010 4:58 PM    Prescriptions: ZOLOFT 50 MG TABS (SERTRALINE HCL) 2 by mouth once daily  #30 x 0   Entered by:   Army Fossa CMA   Authorized by:   Nolon Rod. Paz MD   Signed by:   Army Fossa CMA on 05/18/2010   Method used:   Printed then faxed to ...       [id number] (retail)       [address]       [place], [place]  [address]       Ph: [phone] or [phone]       Fax: [phone]   RxID:   [id number]

## 2010-09-08 NOTE — Assessment & Plan Note (Signed)
Summary: Per daughter needs to see dr/drb   Vital Signs:  Patient profile:   77 year old female Weight:      130.25 pounds Pulse rate:   111 / minute Pulse rhythm:   regular BP sitting:   132 / 84  (left arm) Cuff size:   regular  Vitals Entered By: Army Fossa CMA (April 22, 2010 3:01 PM) CC: [facility] Comments Pt fell yesterday CT and X-rays were done- everything normal.   History of Present Illness: had a fall 04-21-10 She was walking to the dining room, stumbled and fell facedown At the ER, her vital signs were stable X-rays reports are reviewed- CT of the head stable Maxillofacial CT showing no fracture Cervical spine CT negative except for DJD X-rays of the pelvis were negative  the patient states  she simply tripped and fell, no loss of consciousness or syncope. No chest pain or shortness of breath   ROS today, after the fall, she is sore but specifically denies neck pain or hip pain The daughter reports depression I had a long discussion with the patient about this issue, she admits to be somehow depressed but not suicidal. Mostly ,  the  problem is related to the fact that she lives in an assisted living facility and she is not happy about it. At the same time recognizes that for the most part she is treated well. On the other hand she reports that people is "using my coomb and eating my food"  sometimes     Current Medications (verified): 1)  Namenda 5 Mg Tabs (Memantine Hcl) .Marland Kitchen.. 1 By Mouth Two Times A Day 2)  Aricept 10 Mg Tabs (Donepezil Hydrochloride) .Marland Kitchen.. 1 By Mouth Once Daily 3)  Femara 2.5 Mg Tabs (Letrozole) .Marland Kitchen.. 1 By Mouth Once Daily 4)  Synthroid 75 Mcg Tabs (Levothyroxine Sodium) .Marland Kitchen.. 1 By Mouth Daily. 5)  Zyrtec 10 Mg Tabs (Cetirizine Hcl) .Marland Kitchen.. 1 By Mouth At Bedtime 6)  Triamterene-Hctz 37.5-25 Mg Caps (Triamterene-Hctz) .Marland Kitchen.. 1 By Mouth Once Daily 7)  Bayer Low Strength 81 Mg Tbec (Aspirin) .Marland Kitchen.. 1 By Mouth Every Other  Day 8)  Centrum Ultra Womens  Tabs (Multiple Vitamins-Minerals) .Marland Kitchen.. 1 By Mouth Once Daily 9)  Hectorol 0.5 Mcg Caps (Doxercalciferol) .Marland Kitchen.. 1 By Mouth Every M, W, F 10)  Imodium Advanced 2-125 Mg Tabs (Loperamide-Simethicone) .Marland Kitchen.. 1 Tab After Loose Stool Prn - No More Than 3/day 11)  Astepro 0.15 % Soln (Azelastine Hcl) .Marland Kitchen.. 1 Spray Each Nostril Once Daily As Needed Runny Nose 12)  Zoloft 50 Mg Tabs (Sertraline Hcl) .Marland Kitchen.. 1 1/2 By Mouth Once Daily 13)  Tylenol 325 Mg Tabs (Acetaminophen) .... Take Two Tablets Every 4-6 Hours As Needed For Pain 14)  Calcium Carbonate 1250 Mg/56ml Susp (Calcium Carbonate) .... 5 Ml Daily 15)  Aquacel Hydrofiber 4"x4"  Pads (Wound Dressings) .... As Directed 16)  Culturelle For Kids 1 Billion Pack (Lactobacillus Rhamnosus (Gg)) 17)  Walker .... Dx--Gait Disturbances, Frequent Falls 18)  Cipro 250 Mg Tabs (Ciprofloxacin Hcl) .Marland Kitchen.. 1 By Mouth Two Times A Day For 10 Days. 19)  Rollator .... Dx- Gait Disturbance 781.2 20)  3 Different Orders For Pt .... --Okay Mobile X-Ray, Left Ankle, Dx Ankle Sprain-845.00 --Needs A Total of 10 Days of Cipro,  --Okay To Refer To Pt/ Ot- 781.2  Allergies (verified): 1)  ! Augmentin 2)  ! Sulfa  Past History:  Past Medical History: COPD Hyperlipidemia Hypertension Dementia  Renal insufficiency  (creat 2008 was 1.8) Breast cancer, hx of Hyperthyroidism Osteopenia, last dexa aprox 2008 (per daugher) Osteoarthritis Depression  Past Surgical History: Reviewed history from 12/30/2008 and no changes required. Appendectomy Hysterectomy Tonsillectomy  Social History: Reviewed history from 09/24/2009 and no changes required. moved back to [place] 12-2008 lives at  [facility] (ALF)  tobacco-- quit years ago  ETOH-- socially   Physical Exam  General:  alert and well-developed.   Head:  face is symmetric except for the ecchymoses in the right cheek Eyes:  external ocular movements intact Neck:  neck is full range of  motion Lungs:  Normal respiratory effort, chest expands symmetrically. Lungs are clear to auscultation, no crackles or wheezes. Heart:  normal rate, regular rhythm, and no murmur.   Extremities:  no edema Psych:  Oriented X3, memory intact for recent and remote, normally interactive, good eye contact, not anxious appearing, and not depressed appearing.     Impression & Recommendations:  Problem # 1:  GAIT DISTURBANCE (ICD-781.2) status post a mechanical fall she is getting a walker  Problem # 2:  HYPERTHYROIDISM (ICD-242.90) see  instructions  Problem # 3:  UTI (ICD-599.0) see  instructions Her updated medication list for this problem includes:    Cipro 250 Mg Tabs (Ciprofloxacin hcl) .Marland Kitchen... 1 by mouth two times a day for 10 days.  Problem # 4:  DEMENTIA (ICD-294.8) she is on Namenda 5  months ago after she saw neurology The daughter has not noticed any major change (positive and negative) on the patient. She has some delusions No aggressive behavior We'll continue with Namenda for now, consider discontinuing in 3 months The daughter does not plan to get her mother back to the neurologist Monitor delusions   Problem # 5:  DEPRESSION (ICD-311) the patient has depression but is not suicidal Has been taking Zoloft for a while She is counseled that, she is unhappy about the assisted living facility but in a way she has no choice but live there We agreed to increase Zoloft The patient & the daughter are in agreement Her updated medication list for this problem includes:    Zoloft 50 Mg Tabs (Sertraline hcl) .Marland Kitchen... 2 by mouth once daily  Problem # 6:  face-to-face more than 25 minutes d/t  chart review, counseling, and discussing the issues with the patient's daughter  Complete Medication List: 1)  Namenda 5 Mg Tabs (Memantine hcl) .Marland Kitchen.. 1 by mouth two times a day 2)  Aricept 10 Mg Tabs (Donepezil hydrochloride) .Marland Kitchen.. 1 by mouth once daily 3)  Femara 2.5 Mg Tabs (Letrozole) .Marland Kitchen.. 1  by mouth once daily 4)  Synthroid 75 Mcg Tabs (Levothyroxine sodium) .Marland Kitchen.. 1 by mouth daily. 5)  Zyrtec 10 Mg Tabs (Cetirizine hcl) .Marland Kitchen.. 1 by mouth at bedtime 6)  Triamterene-hctz 37.5-25 Mg Caps (Triamterene-hctz) .Marland Kitchen.. 1 by mouth once daily 7)  Bayer Low Strength 81 Mg Tbec (Aspirin) .Marland Kitchen.. 1 by mouth every other day 8)  Centrum Ultra Womens Tabs (Multiple vitamins-minerals) .Marland Kitchen.. 1 by mouth once daily 9)  Hectorol 0.5 Mcg Caps (Doxercalciferol) .Marland Kitchen.. 1 by mouth every m, w, f 10)  Imodium Advanced 2-125 Mg Tabs (Loperamide-simethicone) .Marland Kitchen.. 1 tab after loose stool prn - no more than 3/day 11)  Astepro 0.15 % Soln (Azelastine hcl) .Marland Kitchen.. 1 spray each nostril once daily as needed runny nose 12)  Zoloft 50 Mg Tabs (Sertraline hcl) .... 2 by mouth once daily 13)  Tylenol 325 Mg Tabs (Acetaminophen) .... Take two  tablets every 4-6 hours as needed for pain 14)  Calcium Carbonate 1250 Mg/55ml Susp (Calcium carbonate) .... 5 ml daily 15)  Aquacel Hydrofiber 4"x4" Pads (Wound dressings) .... As directed 16)  Culturelle For Kids 1 Billion Pack (Lactobacillus rhamnosus (gg)) 17)  Walker  .... Dx--gait disturbances, frequent falls 18)  Cipro 250 Mg Tabs (Ciprofloxacin hcl) .Marland Kitchen.. 1 by mouth two times a day for 10 days. 19)  Rollator  .... Dx- gait disturbance 781.2 20)  3 Different Orders For Pt  .... --okay mobile x-ray, left ankle, dx ankle sprain-845.00 --needs a total of 10 days of cipro,  --okay to refer to pt/ ot- 781.2  Patient Instructions: 1)  call if redness or discharge from the wound in the R arm 2)  increase zoloft to 2 tablets a day 3)  finish cipro as prescribed 4)  came back for a TSH (dx hypothyroidism) and  a UCX by 05-21-10 dx UTI Prescriptions: ZOLOFT 50 MG TABS (SERTRALINE HCL) 2 by mouth once daily  #0 x 0   Entered by:   Army Fossa CMA   Authorized by:   Nolon Rod. Assata Juncaj MD   Signed by:   Army Fossa CMA on 04/22/2010   Method used:   Print then Give to Patient   RxID:    [id number]

## 2010-09-08 NOTE — Miscellaneous (Signed)
Summary: Discharge/[facility]  Discharge/[facility]   Imported By: Lanelle Bal 05/26/2010 15:15:34  _____________________________________________________________________  External Attachment:    Type:   Image     Comment:   External Document

## 2010-09-08 NOTE — Progress Notes (Signed)
Summary: lab results  Phone Note Outgoing Call   Summary of Call: please discuss with the patient's daughter, Ms. Maralyn Sago -needs less Synthroid, decrease Synthroid from 75 micrograms to 50 micrograms every day.  Needs a TSH check in 6 weeks, -has a UTI, Cipro 250 mg one p.o. twice day for one week, needs a urine culture done in 6 weeks -kidney function is stable, send results to nephrology -vitamin D slightly low, calcium normal.  Will let nephrology follow up on vitamin D management Jose E. Paz MD  January 18, 2010 2:05 PM   Follow-up for Phone Call        DISCUSS WITH PATIENT daughter, copy given to daughter, labs faxed.............Marland KitchenFelecia Deloach CMA  January 19, 2010 10:27 AM     New/Updated Medications: SYNTHROID 50 MCG TABS (LEVOTHYROXINE SODIUM) Take 1 tab  every day CIPRO 250 MG TABS (CIPROFLOXACIN HCL) Take 1 tab by mouth two times a day for one week Prescriptions: CIPRO 250 MG TABS (CIPROFLOXACIN HCL) Take 1 tab by mouth two times a day for one week  #14 x 0   Entered by:   Jeremy Johann CMA   Authorized by:   Nolon Rod. Paz MD   Signed by:   Jeremy Johann CMA on 01/19/2010   Method used:   Faxed to ...       [id number] (retail)       [address]       [place], [place]  [address]       Ph: [phone] or [phone]       Fax: [phone]   RxID:   [id number] SYNTHROID 50 MCG TABS (LEVOTHYROXINE SODIUM) Take 1 tab  every day  #30 x 1   Entered by:   Jeremy Johann CMA   Authorized by:   Nolon Rod. Paz MD   Signed by:   Jeremy Johann CMA on 01/19/2010   Method used:   Faxed to ...       [id number] (retail)       [address]       [place], [place]  [address]       Ph: [phone] or [phone]       Fax: [phone]   RxID:   [id number]

## 2010-09-08 NOTE — Assessment & Plan Note (Signed)
Summary: LOOK AT TOES; SINUS INF??, DISCUSS PAPERWORK///PH   Vital Signs:  Patient profile:   77 year old female Height:      63 inches Weight:      128.4 pounds Pulse rate:   76 / minute BP sitting:   100 / 60  Vitals Entered By: Shary Decamp (September 24, 2009 3:39 PM) Comments  - check toenails  - ? sinus infection, still dizzy, has neuro referral pending  - needs statement for lawyer stating that she is capable of making decisions concerning [facility]  September 24, 2009 3:39 PM    History of Present Illness: check toenails: long history of thickened toenails, in the past apparently she was diagnosed with onychomycoses she used to see a podiatrist and get her nails trimmed  but she has not done that in a while complain of pain in the right great toe  after the last office visit, she did have another fall; patient thinks that she fell because she was dizzy since that episode however the dizziness has resolved the dizziness used to be worse with head motion denies slurred speech, headache, diplopia, now sure has neuro referral pending  sinus congestion, mild, denies fever or cough  needs statement for lawyer stating that she is capable of making decisions concerning her [facility] [place]  Allergies: 1)  ! Augmentin 2)  ! Sulfa  Past History:  Past Medical History: Reviewed history from 02/18/2009 and no changes required. COPD Hyperlipidemia Hypertension Dementia  Renal insufficiency  (creat 2008 was 1.8) Breast cancer, hx of Hyperthyroidism Osteopenia, last dexa aprox 2008 (per daugher) Osteoarthritis  Past Surgical History: Reviewed history from 12/30/2008 and no changes required. Appendectomy Hysterectomy Tonsillectomy  Social History: moved back to [place] 12-2008 lives at  [facility] (ALF)  tobacco-- quit years ago  ETOH-- socially   Review of Systems      See  HPI  Physical Exam  General:  alert and well-developed.   Lungs:  Normal respiratory effort, chest expands symmetrically. Lungs are clear to auscultation, no crackles or wheezes. Heart:  normal rate, regular rhythm, and no murmur.   Extremities:  no edema Neurologic:  alert in time space and person operative, coherent motor exam is symmetric speech is fluent Skin:  several toenails are large and thick right great toe (which is the one hurting)  shows no evidence of cellulitis but it does half a callus (?wart)at the  tip of the toe  in general,  toes  look dry and scaly Psych:  not anxious appearing and not depressed appearing.     Impression & Recommendations:  Problem # 1:  ONYCHOMYCOSIS (ICD-110.1) right great toe pain likely related to callus/wart I will prescribe Lotrisone to use in the toes also recommend to see a podiatrist  and get her nails trim (pt quite reluctant to do so) I don't believe p.o. Lamisil would be beneficial to the patient at this point Her updated medication list for this problem includes:    Lotrisone 1-0.05 % Crea (Clotrimazole-betamethasone) .Marland Kitchen... Apply two times a day x 2 weeks to toes  Problem # 2:  DIZZINESS (ICD-780.4) dizziness has resolved, neurology appointment is pending she had falls recently,  I suggested PT referral to help with her balance but she declined will wait and see what the neurologist suggests Her updated medication list for this problem includes:    Zyrtec 10 Mg  Tabs (Cetirizine hcl) .Marland Kitchen... 1 by mouth at bedtime  Problem # 3:  DEMENTIA (ICD-294.8) although she carries a diagnosis of dementia, today she is alert oriented x 3 coherent and cooperative I think that in general  she is able to make her own decisions. see letter   Complete Medication List: 1)  Aricept 10 Mg Tabs (Donepezil hydrochloride) .Marland Kitchen.. 1 by mouth once daily 2)  Femara 2.5 Mg Tabs (Letrozole) .Marland Kitchen.. 1 by mouth once daily 3)  Synthroid 75 Mcg Tabs (Levothyroxine  sodium) .Marland Kitchen.. 1 by mouth once daily 4)  Zyrtec 10 Mg Tabs (Cetirizine hcl) .Marland Kitchen.. 1 by mouth at bedtime 5)  Triamterene-hctz 37.5-25 Mg Caps (Triamterene-hctz) .Marland Kitchen.. 1 by mouth once daily 6)  Bayer Low Strength 81 Mg Tbec (Aspirin) .Marland Kitchen.. 1 by mouth every other day 7)  Centrum Ultra Womens Tabs (Multiple vitamins-minerals) .Marland Kitchen.. 1 by mouth once daily 8)  Hectorol 0.5 Mcg Caps (Doxercalciferol) .Marland Kitchen.. 1 by mouth every m, w, f 9)  Imodium Advanced 2-125 Mg Tabs (Loperamide-simethicone) .Marland Kitchen.. 1 tab after loose stool prn - no more than 3/day 10)  Astepro 0.15 % Soln (Azelastine hcl) .Marland Kitchen.. 1 spray each nostril once daily as needed runny nose 11)  Zoloft 50 Mg Tabs (Sertraline hcl) .Marland Kitchen.. 1 1/2 by mouth once daily 12)  Tylenol 325 Mg Tabs (Acetaminophen) .... Take two tablets every 4-6 hours as needed for pain 13)  Calcium Carbonate 1250 Mg/20ml Susp (Calcium carbonate) .... 5 ml daily 14)  Lotrisone 1-0.05 % Crea (Clotrimazole-betamethasone) .... Apply two times a day x 2 weeks to toes 15)  Aquacel Hydrofiber 4"x4" Pads (Wound dressings) .... As directed 16)  Premarin 0.625 Mg/gm Crea (Estrogens, conjugated) .... Apply cream to vaginal area m,w,f x3 months Prescriptions: LOTRISONE 1-0.05 % CREA (CLOTRIMAZOLE-BETAMETHASONE) apply two times a day x 2 weeks to toes  #1 x 1   Entered by:   Shary Decamp   Authorized by:   Nolon Rod. Markan Cazarez MD   Signed by:   Shary Decamp on 09/24/2009   Method used:   Reprint   RxID:   [id number] LOTRISONE 1-0.05 % CREA (CLOTRIMAZOLE-BETAMETHASONE) apply two times a day x 2 weeks to toes  #1 x 1   Entered and Authorized by:   Nolon Rod. Paulla Mcclaskey MD   Signed by:   Nolon Rod. Candas Deemer MD on 09/24/2009   Method used:   Electronically to        [id number] ([facility]       [address]       [place], [place]  [address]       Ph: [phone] or [phone]       Fax: [phone]   RxID:   [id number]

## 2010-09-08 NOTE — Consult Note (Signed)
Summary: [facility]  [facility]   Imported By: Lanelle Bal 03/18/2010 11:53:51  _____________________________________________________________________  External Attachment:    Type:   Image     Comment:   External Document

## 2010-09-08 NOTE — Miscellaneous (Signed)
Summary: Case Conference/[facility]  Case Conference/[facility]   Imported By: Lanelle Bal 05/18/2010 12:34:59  _____________________________________________________________________  External Attachment:    Type:   Image     Comment:   External Document

## 2010-09-08 NOTE — Miscellaneous (Signed)
Summary: Sore Throat Order/[facility]  Sore Throat Order/[facility]   Imported By: Lanelle Bal 01/12/2010 13:20:35  _____________________________________________________________________  External Attachment:    Type:   Image     Comment:   External Document

## 2010-09-08 NOTE — Miscellaneous (Signed)
Summary: Notice of Patient Fall/[facility]  Notice of Patient Fall/[facility]   Imported By: Lanelle Bal 06/25/2010 11:32:10  _____________________________________________________________________  External Attachment:    Type:   Image     Comment:   External Document

## 2010-09-08 NOTE — Miscellaneous (Signed)
Summary: D/C [facility]  D/C K[facility]lex/[facility]   Imported By: Lanelle Bal 08/20/2009 15:13:50  _____________________________________________________________________  External Attachment:    Type:   Image     Comment:   External Document

## 2010-09-08 NOTE — Progress Notes (Signed)
Summary: Femara  Phone Note Call from Patient   Caller: Daughter Summary of Call: Pts daugther states that pt has been on the Femara for almost 5 years. Pt states that Dr.Paz has been filling the medication for her. She would like to know does the pt need to take it 5 years from the date of the surgery, or does she stop it? Please advise.  Initial call taken by: Army Fossa CMA,  June 03, 2010 9:42 AM  Follow-up for Phone Call        after 5 years is ok to d/c it  Follow-up by: [facility] E. Paz MD,  June 04, 2010 8:01 AM  Additional Follow-up for Phone Call Additional follow up Details #1::        Pts mother is aware. Army Fossa CMA  June 04, 2010 8:51 AM

## 2010-09-08 NOTE — Progress Notes (Signed)
Summary: needs prescription for Zoloft  Phone Note Call from Patient   Caller: Claudia [name] at [facility] Summary of Call: Meds Nurse says that patient needs a new prescription for Zoloft sent to [facility] so this medicaion can be ordered Initial call taken by: Jerolyn Shin,  June 10, 2010 2:07 PM  Follow-up for Phone Call        [facility]  Follow-up by: Army Fossa CMA,  June 10, 2010 2:10 PM    Prescriptions: ZOLOFT 50 MG TABS (SERTRALINE HCL) 2 by mouth once daily  #30 x 1   Entered by:   Army Fossa CMA   Authorized by:   Nolon Rod. Paz MD   Signed by:   Army Fossa CMA on 06/10/2010   Method used:   Print then Give to Patient   RxID:   [id number]   Appended Document: needs prescription for Zoloft Pharmacy called to question dispense number, I ok'd for them to change to #60 (based on sig- two times a day)

## 2010-09-08 NOTE — Progress Notes (Signed)
Summary: labs   Phone Note Outgoing Call   Summary of Call: advised patient's daughter has a UTI, start Cipro 250 mg b.i.d. for 10 days Urine culture in 6  weeks Also needs more Synthroid, increased from 50 to 75 micrograms daily, TSH in 6 weeks Jose E. Paz MD  April 06, 2010 1:58 PM   Follow-up for Phone Call        Pts daughter is aware meds sent in, pts facility also requires a paper copy of rx will give to pts daughter. Army Fossa CMA  April 06, 2010 2:20 PM     New/Updated Medications: SYNTHROID 75 MCG TABS (LEVOTHYROXINE SODIUM) 1 by mouth daily. CIPRO 250 MG TABS (CIPROFLOXACIN HCL) 1 by mouth two times a day for 10 days. Prescriptions: CIPRO 250 MG TABS (CIPROFLOXACIN HCL) 1 by mouth two times a day for 10 days.  #0 x 0   Entered by:   Army Fossa CMA   Authorized by:   Nolon Rod. Paz MD   Signed by:   Army Fossa CMA on 04/06/2010   Method used:   Print then Give to Patient   RxID:   [id number] SYNTHROID 75 MCG TABS (LEVOTHYROXINE SODIUM) 1 by mouth daily.  #0 x 0   Entered by:   Army Fossa CMA   Authorized by:   Nolon Rod. Paz MD   Signed by:   Army Fossa CMA on 04/06/2010   Method used:   Print then Give to Patient   RxID:   [id number] SYNTHROID 75 MCG TABS (LEVOTHYROXINE SODIUM) 1 by mouth daily.  #30 x 2   Entered by:   Army Fossa CMA   Authorized by:   Nolon Rod. Paz MD   Signed by:   Army Fossa CMA on 04/06/2010   Method used:   Electronically to        [id number]       [address]       [place], [place]  [address]       Ph: [phone] or [phone]       Fax: [phone]   RxID:   [id number] CIPRO 250 MG TABS (CIPROFLOXACIN HCL) 1 by mouth two times a day for 10 days.  #20 x 0   Entered by:   Army Fossa CMA   Authorized by:   Nolon Rod. Paz MD   Signed by:   Army Fossa CMA on 04/06/2010   Method used:   Electronically to        [id number] ([facility]       [address]       [place], [place]  [address]       Ph: [phone] or [phone]       Fax: [phone]   RxID:   [id number]

## 2010-09-08 NOTE — Progress Notes (Signed)
Summary: ? CT scan  Phone Note Call from Patient   Caller: Daughter Summary of Call: neuro had mentioned that pt had a lot of atrophy on her CT from 1/3.  Daughter is concerned.  I put a copy of CT in EMR.  It does show some white matter....  is this normal for patients age? Should they be concerned?  Patient did have a MRI brain 3/18 ordered by [facility] neuro Shary Decamp  October 28, 2009 4:55 PM   Follow-up for Phone Call        CT reviewed,has changes c/w ischemia which are tiny , asx strokes that happened  overtime. Very common at her age. Treatment: control CV risk factors. Bones (skull) w/ some changes, could be myeloma, a sort of uncommon condition, we can check/evaluate  that on RTC Kasmira Cacioppo E. Arohi Salvatierra MD  October 28, 2009 8:25 PM   discussed with daughter Follow-up by: Shary Decamp,  October 29, 2009 10:00 AM

## 2010-09-08 NOTE — Miscellaneous (Signed)
Summary: FL2/[facility]  FL[facility]   Imported By: Lanelle Bal 12/02/2009 12:01:19  _____________________________________________________________________  External Attachment:    Type:   Image     Comment:   External Document

## 2010-09-08 NOTE — Progress Notes (Signed)
Summary: ??referral  Phone Note Call from Patient   Summary of Call: requesting referral to neurology because still having sxs of feeling lightheaded, unsteady gait.....    has rov with dr Drue Novel on 09/24/09 Claudia [name]  September 22, 2009 10:15 AM

## 2010-09-08 NOTE — Medication Information (Signed)
Summary: Tylenol Order/[facility]  Tylenol Order/[facility]   Imported By: Lanelle Bal 12/31/2009 08:14:07  _____________________________________________________________________  External Attachment:    Type:   Image     Comment:   External Document

## 2010-09-08 NOTE — Progress Notes (Signed)
Summary: rx fo walker  Phone Note Call from Patient   Summary of Call: patient called & asked if dr Drue Novel could write rx for a walker called "rollator" - she went to [facility] & wasnt happy with the walkers there  please fax to attn anita @ [facility] fax [phone]  Initial call taken by: Okey Regal Spring,  April 06, 2010 4:20 PM  Follow-up for Phone Call        please do, diagnosis is GAIT DISTURBANCE (ICD-781.2) Follow-up by: Nolon Rod. Karris Deangelo MD,  April 06, 2010 6:00 PM  Additional Follow-up for Phone Call Additional follow up Details #1::        faxed to [facility]. Army Fossa CMA  April 07, 2010 9:11 AM     New/Updated Medications: * ROLLATOR Dx- Gait Disturbance 781.2 Prescriptions: ROLLATOR Dx- Gait Disturbance 781.2  #1 x 0   Entered by:   Army Fossa CMA   Authorized by:   Nolon Rod. Delorice Bannister MD   Signed by:   Army Fossa CMA on 04/07/2010   Method used:   Print then Give to Patient   RxID:   [id number]

## 2010-09-08 NOTE — Letter (Signed)
Summary: CKD stable, no change-----[facility]  [facility]   Imported By: Lanelle Bal 02/18/2010 08:15:49  _____________________________________________________________________  External Attachment:    Type:   Image     Comment:   External Document

## 2010-09-08 NOTE — Miscellaneous (Signed)
Summary: Physician Orders/[facility]  Physician Orders/[facility]   Imported By: Lanelle Bal 03/19/2010 08:30:52  _____________________________________________________________________  External Attachment:    Type:   Image     Comment:   External Document

## 2010-09-08 NOTE — Miscellaneous (Signed)
Summary: D/C Fluticasone/[facility]  D/C Fluticasone/[facility]   Imported By: Lanelle Bal 06/10/2010 12:26:58  _____________________________________________________________________  External Attachment:    Type:   Image     Comment:   External Document

## 2010-09-08 NOTE — Medication Information (Signed)
Summary: Namenda Change Request/[facility]  Namenda Change Request/[facility]   Imported By: Lanelle Bal 02/17/2010 13:15:26  _____________________________________________________________________  External Attachment:    Type:   Image     Comment:   External Document

## 2010-09-08 NOTE — Progress Notes (Signed)
Summary: SYMPTOMS?? FROM UTI  ---- Converted from flag ---- ---- 05/14/2010 8:43 AM, Jose E. Paz MD wrote:  Claudia [name]  : Rahima's last culture is positive, I'm not sure if I need to treat her.  Is she having fever?  Is she having blood in the urine or burning when she urinates?   ------------------------------

## 2010-09-08 NOTE — Progress Notes (Signed)
Summary: RX request  Phone Note Other Incoming   Summary of Call: Will prescription be called in for this patient? Daughter states that you told her last week that if the patient had symptoms, a prescription would be called in. Please advise. Initial call taken by: Lucious Groves CMA,  May 21, 2010 2:32 PM  Follow-up for Phone Call        Cipro 250 mg one p.o. b.i.d. for 3 days while new  culture is pending Follow-up by: Ramiel Forti E. Chemere Steffler MD,  May 22, 2010 4:21 PM  Additional Follow-up for Phone Call Additional follow up Details #1::        Pts daughter is aware. Army Fossa CMA  May 22, 2010 4:25 PM     New/Updated Medications: CIPRO 250 MG TABS (CIPROFLOXACIN HCL) 1 by mouth two times a day for 3 days. Prescriptions: CIPRO 250 MG TABS (CIPROFLOXACIN HCL) 1 by mouth two times a day for 3 days.  #6 x 0   Entered by:   Army Fossa CMA   Authorized by:   Nolon Rod. Damarrion Mimbs MD   Signed by:   Army Fossa CMA on 05/22/2010   Method used:   Electronically to        [facility] (ret[facility]       [address]       [place], [place]  [address]       Ph: [phone] or [phone]       Fax: [phone]   RxID:   [id number]

## 2010-09-08 NOTE — Letter (Signed)
Summary: Generic Letter  [facility]  [address] [place], [place] [address]   Phone: [phone]  Fax: [phone]           09/25/2009   To whom it may concern   This letter is in reference to one of my patients Claudia [name] (DOB 02/02/1923) , she is a delightful 77 year old female who is seen in the office regularly.  Is my opinion based on my conversations with the patient that she is competent to make her own decisions.  If you have any questions regards [name] please let me know.   Sincerely,        Willow Ora MD

## 2010-09-08 NOTE — Miscellaneous (Signed)
Summary: Care Plan/[facility] Plan/[facility]   Imported By: Lanelle Bal 05/01/2010 13:57:10  _____________________________________________________________________  External Attachment:    Type:   Image     Comment:   External Document

## 2010-09-08 NOTE — Assessment & Plan Note (Signed)
Summary: DISCUSS DIZZINESS&NEED FOR WALKER, URINE RECHK AND TSH///[facility]   Vital Signs:  Patient profile:   77 year old female Weight:      130.38 pounds Pulse rate:   105 / minute Pulse rhythm:   regular BP sitting:   114 / 70  (left arm) Cuff size:   regular  Vitals Entered By: Army Fossa CMA (April 03, 2010 1:37 PM) CC: Discuss Dizziness. Comments Has been dizzy for 1 month. Discuss walker. Per daughter- she is bleeding from somewhere she has noticied when she is washing her clothes somewhere on the front.  Check TSH and Urine in lab   History of Present Illness: here with her daughter  --the patient complained of dizziness for several weeks, daughter thinks that this is going on for maybe months since she had a head contusion Symptoms are worse when she bends over or walks, not typically worse when she turns in bed. She's not taken any new medicines Symptoms may  last hours to one day no associated diplopia, nausea, headache, slurred speech  --gait disorder Several falls in the last few years, currently using a walker. 3 weeks ago she failed, EMS was called, she refused a ER evaluation Michaelyn Barter if she needs a walker  --skin issues Continue with bleeding spots, this happens randomly  --hypothyroidism ,due for a  TSH  --UTI, due for a repeat urine cx    Current Medications (verified): 1)  Aricept 10 Mg Tabs (Donepezil Hydrochloride) .Marland Kitchen.. 1 By Mouth Once Daily 2)  Femara 2.5 Mg Tabs (Letrozole) .Marland Kitchen.. 1 By Mouth Once Daily 3)  Synthroid 50 Mcg Tabs (Levothyroxine Sodium) .... Take 1 Tab  Every Day 4)  Zyrtec 10 Mg Tabs (Cetirizine Hcl) .Marland Kitchen.. 1 By Mouth At Bedtime 5)  Triamterene-Hctz 37.5-25 Mg Caps (Triamterene-Hctz) .Marland Kitchen.. 1 By Mouth Once Daily 6)  Bayer Low Strength 81 Mg Tbec (Aspirin) .Marland Kitchen.. 1 By Mouth Every Other Day 7)  Centrum Ultra Womens  Tabs (Multiple Vitamins-Minerals) .Marland Kitchen.. 1 By Mouth Once Daily 8)  Hectorol 0.5 Mcg Caps (Doxercalciferol) .Marland Kitchen.. 1 By Mouth  Every M, W, F 9)  Imodium Advanced 2-125 Mg Tabs (Loperamide-Simethicone) .Marland Kitchen.. 1 Tab After Loose Stool Prn - No More Than 3/day 10)  Astepro 0.15 % Soln (Azelastine Hcl) .Marland Kitchen.. 1 Spray Each Nostril Once Daily As Needed Runny Nose 11)  Zoloft 50 Mg Tabs (Sertraline Hcl) .Marland Kitchen.. 1 1/2 By Mouth Once Daily 12)  Tylenol 325 Mg Tabs (Acetaminophen) .... Take Two Tablets Every 4-6 Hours As Needed For Pain 13)  Calcium Carbonate 1250 Mg/33ml Susp (Calcium Carbonate) .... 5 Ml Daily 14)  Aquacel Hydrofiber 4"x4"  Pads (Wound Dressings) .... As Directed 15)  Culturelle For Kids 1 Billion Pack (Lactobacillus Rhamnosus (Gg)) 16)  Namenda 5 Mg Tabs (Memantine Hcl) .Marland Kitchen.. 1 By Mouth Two Times A Day  Allergies: 1)  ! Augmentin 2)  ! Sulfa  Past History:  Past Medical History: Reviewed history from 02/18/2009 and no changes required. COPD Hyperlipidemia Hypertension Dementia  Renal insufficiency  (creat 2008 was 1.8) Breast cancer, hx of Hyperthyroidism Osteopenia, last dexa aprox 2008 (per daugher) Osteoarthritis  Past Surgical History: Reviewed history from 12/30/2008 and no changes required. Appendectomy Hysterectomy Tonsillectomy  Social History: Reviewed history from 09/24/2009 and no changes required. moved back to [place] 12-2008 lives at  [facility] ([facility]F)  tobacco-- quit years ago  ETOH-- socially   Review of Systems General:  Denies fever; no more fatigue than usual. GI:  Denies bloody stools, nausea,  and vomiting. GU:  Denies dysuria and hematuria.  Physical Exam  General:  alert and well-developed.  pleasent and cooperative Neck:  normal carotid upstroke B   Lungs:  Normal respiratory effort, chest expands symmetrically. Lungs are clear to auscultation, no crackles or wheezes. Heart:  normal rate, regular rhythm, and no murmur.   Extremities:  no lower extremity edema Neurologic:  alert in time space and person motor exam is symmetric speech fluent speech is fluent memory  is appropriate DTRs symmetric Romberg absent  Skin:  she has one spot in the back and  another in the right breast: they look like superficial excoriations.Mild bleeding, spots covered w/ a  Band-Aid Psych:  not anxious appearing and not depressed appearing.     Impression & Recommendations:  Problem # 1:  SKIN LESION (ICD-709.9) excoriations in the skin, recommend observation. If there is any particular area that keeps bleeding and not healing she is to let me know. She saw dermatology, multiple lesions noted, no intervention needed  Problem # 2:  UTI (ICD-599.0)  recheck a urine culture she is asymptomatic, will treat based on culture if needed  Orders: Specimen Handling ([address]) T-Culture, Urine ([address]454-09811) T-Urine Microscopic (91478-29562) UA Dipstick w/o Micro (manual) ([address]1002)  Problem # 3:  GAIT DISTURBANCE (ICD-781.2) definitely needs a walker  Problem # 4:  HYPERTHYROIDISM (ICD-242.90)  due for a TSH  check  Orders: Specimen Handling ([address])  Problem # 5:  DIZZINESS (ICD-780.4)  dizzy again Neurological symptoms at baseline Symptoms have peripheral  features recommend observation  Her updated medication list for this problem includes:    Zyrtec 10 Mg Tabs (Cetirizine hcl) .Marland Kitchen... 1 by mouth at bedtime  Orders: Venipuncture ([address]) Specimen Handling ([address])  Complete Medication List: 1)  Namenda 5 Mg Tabs (Memantine hcl) .Marland Kitchen.. 1 by mouth two times a day 2)  Aricept 10 Mg Tabs (Donepezil hydrochloride) .Marland Kitchen.. 1 by mouth once daily 3)  Femara 2.5 Mg Tabs (Letrozole) .Marland Kitchen.. 1 by mouth once daily 4)  Synthroid 50 Mcg Tabs (Levothyroxine sodium) .... Take 1 tab  every day 5)  Zyrtec 10 Mg Tabs (Cetirizine hcl) .Marland Kitchen.. 1 by mouth at bedtime 6)  Triamterene-hctz 37.5-25 Mg Caps (Triamterene-hctz) .Marland Kitchen.. 1 by mouth once daily 7)  Bayer Low Strength 81 Mg Tbec (Aspirin) .Marland Kitchen.. 1 by mouth every other day 8)  Centrum Ultra Womens Tabs (Multiple vitamins-minerals) .Marland Kitchen.. 1 by  mouth once daily 9)  Hectorol 0.5 Mcg Caps (Doxercalciferol) .Marland Kitchen.. 1 by mouth every m, w, f 10)  Imodium Advanced 2-125 Mg Tabs (Loperamide-simethicone) .Marland Kitchen.. 1 tab after loose stool prn - no more than 3/day 11)  Astepro 0.15 % Soln (Azelastine hcl) .Marland Kitchen.. 1 spray each nostril once daily as needed runny nose 12)  Zoloft 50 Mg Tabs (Sertraline hcl) .Marland Kitchen.. 1 1/2 by mouth once daily 13)  Tylenol 325 Mg Tabs (Acetaminophen) .... Take two tablets every 4-6 hours as needed for pain 14)  Calcium Carbonate 1250 Mg/52ml Susp (Calcium carbonate) .... 5 ml daily 15)  Aquacel Hydrofiber 4"x4" Pads (Wound dressings) .... As directed 16)  Culturelle For Kids 1 Billion Pack (Lactobacillus rhamnosus (gg)) 17)  Walker  .... Dx--gait disturbances, frequent falls  Patient Instructions: 1)  Please schedule a follow-up appointment in 3 months .  Prescriptions: WALKER dx--gait disturbances, frequent falls  #1 x 0   Entered and Authorized by:   Elita Quick E. Kinzly Pierrelouis MD   Signed by:   Nolon Rod. Danali Marinos MD on 04/03/2010   Method used:  Print then Give to Patient   RxID:   [id number]   Laboratory Results   Urine Tests   Date/Time Reported: April 03, 2010 2:50 PM   Routine Urinalysis   Color: yellow Appearance: Cloudy Glucose: negative   (Normal Range: Negative) Bilirubin: negative   (Normal Range: Negative) Ketone: moderate (40)   (Normal Range: Negative) Spec. Gravity: 1.020   (Normal Range: 1.003-1.035) Blood: large   (Normal Range: Negative) pH: 5.0   (Normal Range: 5.0-8.0) Protein: 30   (Normal Range: Negative) Urobilinogen: negative   (Normal Range: 0-1) Nitrite: positive   (Normal Range: Negative) Leukocyte Esterace: large   (Normal Range: Negative)    Comments: cx/micro sent [facility]  April 03, 2010 2:50 PM

## 2010-09-08 NOTE — Miscellaneous (Signed)
Summary: Fall Risk Assessment/[facility]  Fall Risk Assessment/[facility]   Imported By: Lanelle Bal 06/16/2010 11:29:46  _____________________________________________________________________  External Attachment:    Type:   Image     Comment:   External Document  Appended Document: Fall Risk Assessment/[facility] please discuss with the patient's daughter, Ms. Sarah. Do  I need to do anything to help her preventing falls?  Appended Document: Fall Risk Assessment/[facility] I spoke with pts daughter she states she does not know what there is to do, her mother is very set in her ways. And does not believe that she needs to exercise to strengthen her legs.

## 2010-09-08 NOTE — Miscellaneous (Signed)
Summary: Notice of Patient Fall/[facility]  Notice of Patient Fall/[facility]   Imported By: Lanelle Bal 07/07/2010 12:01:04  _____________________________________________________________________  External Attachment:    Type:   Image     Comment:   External Document

## 2010-09-08 NOTE — Progress Notes (Signed)
Summary: ?second opinion  Phone Note Call from Patient Call back at [facility] Phone [phone]   Summary of Call: Patient went to see neuro today (Dr. Terrace Arabia).  Patient was dx with mini strokes & was also rx'd Namenda.  Patient is requesting a 2nd opinion because  Dr. Terrace Arabia suggested referral to NS for neck.   referral ok? Claudia [name]  November 14, 2009 11:14 AM   Follow-up for Phone Call        please get OV note Claudia Winthrop E. Kora Groom MD  November 19, 2009 12:52 PM  has been scanned into emr Claudia [name]  November 24, 2009 2:39 PM neurology note reviewed, they feel that the difficulty with gait and sphincter control is due to atherosclerosis  of the brain and cervical myelopathy, the one thing  neurosurgery could do to help is operate on her neck ;I doubt that the patient is a surgical candidate.  If they like a second neurological opinion is okay with me understanding there may not be much to offer her in terms of reduced her symptoms Claudia Gebhard E. Hyun Reali MD  November 25, 2009 8:42 AM  Follow-up by:    Additional Follow-up for Phone Call Additional follow up Details #1::        discussed with daughter Claudia [name]  November 25, 2009 11:34 AM

## 2010-09-08 NOTE — Letter (Signed)
Summary: CMN for Walker/[facility]  CMN for Walker/[facility]   Imported By: Lanelle Bal 04/28/2010 11:05:08  _____________________________________________________________________  External Attachment:    Type:   Image     Comment:   External Document

## 2010-09-08 NOTE — Letter (Signed)
Summary: [facility]  [facility]   Imported By: Lanelle Bal 11/24/2009 12:22:27  _____________________________________________________________________  External Attachment:    Type:   Image     Comment:   External Document

## 2010-09-08 NOTE — Miscellaneous (Signed)
Summary: CT scan  CT Head Without Contrast - STATUS: Final  IMAGE                                     Perform Date: 3 Jan11 05:26  Ordered By: Santa Lighter        Ordered Date: 3 Jan11 04:30  Facility: [facility]                               Department: CT  Service Report Text  [facility] Accession Number: [id number]      Clinical Data: Fall.  Head injury with laceration and hematoma.    CT HEAD WITHOUT CONTRAST    Technique:  Contiguous axial images were obtained from the base of   the skull through the vertex without contrast.    Comparison: 07/17/2009    Findings: The brain stem, cerebellum, cerebral peduncles, thalami,   basal ganglia, basilar cisterns, and ventricular system appear   unremarkable.    Periventricular and corona radiata white matter hypodensities are   most compatible with chronic ischemic microvascular white matter   disease.    No intracranial hemorrhage, mass lesion, or acute infarction is   identified.    Right forehead scalp hematoma noted.    Scattered small lucent lesions noted predominately in the diploic   space of the skull - myeloma cannot be completely excluded.    IMPRESSION:    1.  Stable appearance the brain without acute intracranial findings   identified.   2. Periventricular and corona radiata white matter hypodensities   are most compatible with chronic ischemic microvascular white   matter disease.   3.  Right forehead scalp hematoma.   4.  Scattered small diploic space lucencies in the skull, stable -   myeloma cannot be excluded.    Read By:  Dellia Cloud,  M.D.   Released By:  Dellia Cloud,  M.D.  Additional Information  HL7 RESULT STATUS : F  External image : [phone]  External IF Update Timestamp : 2009-08-11:05:40:06.000000   Clinical Lists Changes

## 2010-09-08 NOTE — Miscellaneous (Signed)
Summary: Orders/[facility]  Orders/[facility]   Imported By: Lanelle Bal 05/30/2010 11:04:12  _____________________________________________________________________  External Attachment:    Type:   Image     Comment:   External Document

## 2010-09-08 NOTE — Miscellaneous (Signed)
Summary: Notice of Patient Fall/[facility]  Notice of Patient Fall/[facility]   Imported By: Lanelle Bal 08/13/2009 12:39:55  _____________________________________________________________________  External Attachment:    Type:   Image     Comment:   External Document

## 2010-09-08 NOTE — Miscellaneous (Signed)
Summary: Tylenol Order/[facility]  Tylenol Order/[facility]   Imported By: Lanelle Bal 12/29/2009 10:26:40  _____________________________________________________________________  External Attachment:    Type:   Image     Comment:   External Document

## 2010-09-08 NOTE — Progress Notes (Signed)
Summary: unsteady gait  Phone Note Call from Patient   Caller: Daughter Summary of Call:  - unsteady gait - worse since fall a few weeks ago  - daughter states she is NOT weak just unsteady  - head is "tilting to the side" this is NOT normal for her per daughter  - last CT was normal SUGGESTIONS? Shary Decamp  August 28, 2009 12:57 PM   Follow-up for Phone Call        my impression was  that she has postconcusion  symptoms, for now recommend observation and a visit in two weeks. If symptoms becomes severe, she has a headache or start with nausea and vomiting let me know  ASAP  Follow-up by: Atina Feeley E. Cale Decarolis MD,  August 28, 2009 1:08 PM  Additional Follow-up for Phone Call Additional follow up Details #1::        discussed with pt daughter Additional Follow-up by: Shary Decamp,  August 29, 2009 12:46 PM

## 2010-09-08 NOTE — Miscellaneous (Signed)
Summary: Skin Tear Order/[facility]  Skin Tear Order/[facility]   Imported By: Lanelle Bal 05/01/2010 14:00:59  _____________________________________________________________________  External Attachment:    Type:   Image     Comment:   External Document

## 2010-09-08 NOTE — Progress Notes (Signed)
  Phone Note Call from Patient   Summary of Call: Patient was in the hospital last night with dehydration.  She has had a GI bug, watery diarrhea.  She has "been sitting" in it & now has a bad rash on buttocks.  The ED did not rx any cream for it.  Rx something or just use OTC desitin?  Also needs order to keep cream in her room. Shary Decamp  August 11, 2009 2:44 PM   Follow-up for Phone Call        mix desitin and hydrocodone 1% OTC two times a day x 10 day Shine Scrogham E. Janara Klett MD  August 11, 2009 4:28 PM    Daughter will be bringing in tube of desitin & 1% hydrocortisone cream.  Patient is allowed to keep this in her room.  She is to mix it & apply to buttocks two times a day for 10 days.  The daughter will also be picking up the Levaquin prescription @ the pharmacy (order was faxed earlier today) & bring to nursing home. Shary Decamp  August 11, 2009 4:57 PM  above order faxed to nursing home [phone] Shary Decamp  August 11, 2009 4:58 PM

## 2010-09-08 NOTE — Progress Notes (Signed)
Summary: burning when urinating  Phone Note Call from Patient   Caller: Daughter Summary of Call: patient is now having burning when urinating - if rx called in [facility] ---call sarah at [address] for addl info.Okey Regal Spring  May 20, 2010 2:08 PM     Follow-up for Phone Call        Would you like to order another culture or treat with ABX? Please advise. Lucious Groves CMA  May 20, 2010 2:36 PM   Additional Follow-up for Phone Call Additional follow up Details #1::        please order a new UA, urine culture dx  uti  Additional Follow-up by: Jose E. Paz MD,  May 20, 2010 5:47 PM    Additional Follow-up for Phone Call Additional follow up Details #2::    done. Lucious Groves CMA  May 21, 2010 9:50 AM   New/Updated Medications: * UA AND URINE CULTURE DX:  UTI Prescriptions: UA AND URINE CULTURE DX:  UTI  #0 x 0   Entered by:   Lucious Groves CMA   Authorized by:   Nolon Rod. Paz MD   Signed by:   Lucious Groves CMA on 05/21/2010   Method used:   Printed then faxed to ...       [facility]       [address]       [place], [place]  [address]       Ph: [phone] or [phone]       Fax: [phone]   RxID:   [id number]

## 2010-09-08 NOTE — Miscellaneous (Signed)
Summary: OT Order / [facility]  OT Order / [facility]   Imported By: Lennie Odor 05/18/2010 14:56:13  _____________________________________________________________________  External Attachment:    Type:   Image     Comment:   External Document

## 2010-09-08 NOTE — Miscellaneous (Signed)
Summary: UPDATED MED LIST  Medications Added CULTURELLE FOR KIDS 1 BILLION PACK (LACTOBACILLUS RHAMNOSUS (GG))  NAMENDA 5 MG TABS (MEMANTINE HCL) 1 by mouth two times a day       Clinical Lists Changes  Medications: Added new medication of CULTURELLE FOR KIDS 1 BILLION PACK (LACTOBACILLUS RHAMNOSUS (GG)) Added new medication of NAMENDA 5 MG TABS (MEMANTINE HCL) 1 by mouth two times a day

## 2010-09-08 NOTE — Miscellaneous (Signed)
Summary: Tylenol Order/[facility]  Tylenol Order/[facility]   Imported By: Lanelle Bal 01/23/2010 10:24:05  _____________________________________________________________________  External Attachment:    Type:   Image     Comment:   External Document

## 2010-09-08 NOTE — Progress Notes (Signed)
Summary: ankle xray results  Phone Note Outgoing Call   Summary of Call: Left message on cell phone informing pts family that her Ankle xray was normal. Army Fossa CMA  April 24, 2010 3:21 PM

## 2010-09-08 NOTE — Miscellaneous (Signed)
Summary: Cipro & Synthroid Orders/[facility]  Cipro & Synthroid Orders/[facility]   Imported By: Lanelle Bal 04/23/2010 09:09:56  _____________________________________________________________________  External Attachment:    Type:   Image     Comment:   External Document

## 2010-09-08 NOTE — Miscellaneous (Signed)
Summary: SN Orders/[facility]  SN Orders/[facility]   Imported By: Lanelle Bal 05/18/2010 12:33:06  _____________________________________________________________________  External Attachment:    Type:   Image     Comment:   External Document

## 2010-09-08 NOTE — Miscellaneous (Signed)
Summary: [name]  [name]   Imported By: Lanelle Bal 06/03/2010 10:26:48  _____________________________________________________________________  External Attachment:    Type:   Image     Comment:   External Document

## 2010-09-08 NOTE — Miscellaneous (Signed)
Summary: Fluticasone Order/[facility]  Fluticasone Order/[facility]   Imported By: Lanelle Bal 05/21/2010 13:13:40  _____________________________________________________________________  External Attachment:    Type:   Image     Comment:   External Document

## 2010-09-10 NOTE — Assessment & Plan Note (Signed)
Summary: cough, look at small sores on upper back///sph   Vital Signs:  Patient profile:   77 year old female Weight:      126.25 pounds O2 Sat:      96 % on Room air Temp:     97.9 degrees F oral Pulse rate:   109 / minute Pulse rhythm:   regular BP sitting:   130 / 84  (left arm) Cuff size:   regular  Vitals Entered By: Army Fossa CMA (August 11, 2010 4:31 PM)  O2 Flow:  Room air CC: Pt here c/o URI?  Comments x 2-3 days  sores on back that are not healing print rx's and give to pt    History of Present Illness: here with her son-in-law One-week history of chest congestion and some sputum production (green)  Continue with skin sores  in the back with occasional bleeding.  Current Medications (verified): 1)  Namenda 5 Mg Tabs (Memantine Hcl) .Marland Kitchen.. 1 By Mouth Two Times A Day 2)  Aricept 10 Mg Tabs (Donepezil Hydrochloride) .Marland Kitchen.. 1 By Mouth Once Daily 3)  Synthroid 75 Mcg Tabs (Levothyroxine Sodium) .Marland Kitchen.. 1 By Mouth Daily. 4)  Zyrtec 10 Mg Tabs (Cetirizine Hcl) .Marland Kitchen.. 1 By Mouth At Bedtime 5)  Triamterene-Hctz 37.5-25 Mg Caps (Triamterene-Hctz) .Marland Kitchen.. 1 By Mouth Once Daily 6)  Bayer Low Strength 81 Mg Tbec (Aspirin) .Marland Kitchen.. 1 By Mouth Every Other Day 7)  Centrum Ultra Womens  Tabs (Multiple Vitamins-Minerals) .Marland Kitchen.. 1 By Mouth Once Daily 8)  Hectorol 0.5 Mcg Caps (Doxercalciferol) .Marland Kitchen.. 1 By Mouth Every M, W, F 9)  Imodium Advanced 2-125 Mg Tabs (Loperamide-Simethicone) .Marland Kitchen.. 1 Tab After Loose Stool Prn - No More Than 3/day 10)  Astepro 0.15 % Soln (Azelastine Hcl) .Marland Kitchen.. 1 Spray Each Nostril Once Daily As Needed Runny Nose 11)  Zoloft 50 Mg Tabs (Sertraline Hcl) .... 2 By Mouth Once Daily 12)  Tylenol 325 Mg Tabs (Acetaminophen) .... Take Two Tablets Every 4-6 Hours As Needed For Pain 13)  Calcium Carbonate 1250 Mg/72ml Susp (Calcium Carbonate) .... 5 Ml Daily  Allergies (verified): 1)  ! Augmentin 2)  ! Sulfa  Past History:  Past Medical History: Reviewed history from  04/22/2010 and no changes required. COPD Hyperlipidemia Hypertension Dementia  Renal insufficiency  (creat 2008 was 1.8) Breast cancer, hx of Hyperthyroidism Osteopenia, last dexa aprox 2008 (per daugher) Osteoarthritis Depression  Review of Systems General:  Denies fever. Resp:  Denies coughing up blood; (+) chest congestion. GI:  Denies nausea and vomiting. MS:  Denies muscle aches.  Physical Exam  General:  alert and well-developed.  no apparent distress Ears:  R ear normal and L ear normal.   Nose:  not congested Mouth:  no redness or discharge Lungs:  Normal respiratory effort, chest expands symmetrically. Lungs are clear to auscultation, no crackles or wheezes. Heart:  normal rate, regular rhythm, and no murmur.   Skin:  several superficial excoriations he did back. Mild bleeding, no skin lesions or moles appreciated. The skin  is quite thin   Impression & Recommendations:  Problem # 1:  URI (ICD-465.9) likely viral illnes see instructions if no better , abx? Her updated medication list for this problem includes:    Zyrtec 10 Mg Tabs (Cetirizine hcl) .Marland Kitchen... 1 by mouth at bedtime    Bayer Low Strength 81 Mg Tbec (Aspirin) .Marland Kitchen... 1 by mouth every other day    Tylenol 325 Mg Tabs (Acetaminophen) .Marland Kitchen... Take two tablets every 4-6 hours  as needed for pain  Problem # 2:  SKIN LESION (ICD-709.9) rec observation again previously seen by derm   Complete Medication List: 1)  Namenda 5 Mg Tabs (Memantine hcl) .Marland Kitchen.. 1 by mouth two times a day 2)  Aricept 10 Mg Tabs (Donepezil hydrochloride) .Marland Kitchen.. 1 by mouth once daily 3)  Synthroid 75 Mcg Tabs (Levothyroxine sodium) .Marland Kitchen.. 1 by mouth daily. 4)  Zyrtec 10 Mg Tabs (Cetirizine hcl) .Marland Kitchen.. 1 by mouth at bedtime 5)  Triamterene-hctz 37.5-25 Mg Caps (Triamterene-hctz) .Marland Kitchen.. 1 by mouth once daily 6)  Bayer Low Strength 81 Mg Tbec (Aspirin) .Marland Kitchen.. 1 by mouth every other day 7)  Centrum Ultra Womens Tabs (Multiple vitamins-minerals) .Marland Kitchen.. 1  by mouth once daily 8)  Hectorol 0.5 Mcg Caps (Doxercalciferol) .Marland Kitchen.. 1 by mouth every m, w, f 9)  Imodium Advanced 2-125 Mg Tabs (Loperamide-simethicone) .Marland Kitchen.. 1 tab after loose stool prn - no more than 3/day 10)  Astepro 0.15 % Soln (Azelastine hcl) .Marland Kitchen.. 1 spray each nostril once daily as needed runny nose 11)  Zoloft 50 Mg Tabs (Sertraline hcl) .... 2 by mouth once daily 12)  Tylenol 325 Mg Tabs (Acetaminophen) .... Take two tablets every 4-6 hours as needed for pain 13)  Calcium Carbonate 1250 Mg/78ml Susp (Calcium carbonate) .... 5 ml daily  Patient Instructions: 1)  fluids 2)  robitussin DM 2 tsp every 6 hours as needed for cough 3)  tylenol as previously prescribed  4)  call if no better in 5 to 6 days 5)  call if symptoms severe , short of breath, high fever 6)  Please schedule a follow-up appointment in 3 months .    Orders Added: 1)  Est. Patient Level III [13086]

## 2010-09-10 NOTE — Letter (Signed)
Summary: [facility]   Imported By: Lanelle Bal 08/17/2010 09:34:20  _____________________________________________________________________  External Attachment:    Type:   Image     Comment:   External Document

## 2010-09-10 NOTE — Miscellaneous (Signed)
Summary: Notice of Patient Fall/[facility]  Notice of Patient Fall/[facility]   Imported By: Lanelle Bal 08/21/2010 11:52:32  _____________________________________________________________________  External Attachment:    Type:   Image     Comment:   External Document

## 2010-09-10 NOTE — Progress Notes (Signed)
Summary: d/c femara  Phone Note Outgoing Call   Summary of Call: old records reviewed was rec to d/c [facility] after 5 years of therapy for breast ca dx 2006 plan: d/c [facility] Jose E. Paz MD  August 07, 2010 12:58 PM   Follow-up for Phone Call        [facility]ed [facility]. Army Fossa CMA  August 07, 2010 2:14 PM     New/Updated Medications: [facility] please d/c femara Prescriptions: FEMARA please d/c femara  #0 x 0   Entered by:   Army Fossa CMA   Authorized by:   Nolon Rod. Paz MD   Signed by:   Army Fossa CMA on 08/07/2010   Method used:   Printed then faxed to ...       [id number] (retail)       [address]       [place], [place]  [address]       Ph: [phone] or [phone]       Fax: [phone]   RxID:   [id number]

## 2010-09-10 NOTE — Progress Notes (Signed)
Summary: Wheelchair rx   Phone Note Call from Patient Call back at [employer] [phone]   Caller: Daughter Summary of Call: Pts daughter is concerned with taking her mother outside of the home without a wheelchair. With her increase falling. Are you okay with me giving a rx for them the get a wheelchair or would you prefer to see the pt first?  Initial call taken by: Army Fossa CMA,  July 28, 2010 4:43 PM  Follow-up for Phone Call        ok to Rx , thank you Jose E. Paz MD  July 29, 2010 5:11 PM   Additional Follow-up for Phone Call Additional follow up Details #1::        gave rx to pts daughter. Army Fossa CMA  July 30, 2010 8:28 AM     New/Updated Medications: * WHEELCHAIR dx- 781.2 Prescriptions: WHEELCHAIR dx- 781.2  #0 x 0   Entered by:   Army Fossa CMA   Authorized by:   Nolon Rod. Paz MD   Signed by:   Army Fossa CMA on 07/30/2010   Method used:   Print then Give to Patient   RxID:   [id number]

## 2010-09-11 ENCOUNTER — Encounter: Payer: Self-pay | Admitting: Internal Medicine

## 2010-09-14 ENCOUNTER — Telehealth: Payer: Self-pay | Admitting: Internal Medicine

## 2010-09-15 ENCOUNTER — Telehealth: Payer: Self-pay | Admitting: Internal Medicine

## 2010-09-16 ENCOUNTER — Encounter: Payer: Self-pay | Admitting: Internal Medicine

## 2010-09-17 DIAGNOSIS — Z0289 Encounter for other administrative examinations: Secondary | ICD-10-CM

## 2010-09-23 ENCOUNTER — Other Ambulatory Visit: Payer: Self-pay | Admitting: Internal Medicine

## 2010-09-23 ENCOUNTER — Ambulatory Visit (INDEPENDENT_AMBULATORY_CARE_PROVIDER_SITE_OTHER): Payer: Medicare Other | Admitting: Internal Medicine

## 2010-09-23 ENCOUNTER — Encounter: Payer: Self-pay | Admitting: Internal Medicine

## 2010-09-23 DIAGNOSIS — N259 Disorder resulting from impaired renal tubular function, unspecified: Secondary | ICD-10-CM

## 2010-09-23 DIAGNOSIS — R269 Unspecified abnormalities of gait and mobility: Secondary | ICD-10-CM

## 2010-09-23 DIAGNOSIS — E0590 Thyrotoxicosis, unspecified without thyrotoxic crisis or storm: Secondary | ICD-10-CM

## 2010-09-23 DIAGNOSIS — M1990 Unspecified osteoarthritis, unspecified site: Secondary | ICD-10-CM

## 2010-09-23 DIAGNOSIS — N390 Urinary tract infection, site not specified: Secondary | ICD-10-CM

## 2010-09-23 LAB — CONVERTED CEMR LAB
Bilirubin Urine: NEGATIVE
Ketones, urine, test strip: NEGATIVE
Urobilinogen, UA: 0.2

## 2010-09-24 ENCOUNTER — Encounter: Payer: Self-pay | Admitting: Internal Medicine

## 2010-09-24 LAB — CONVERTED CEMR LAB
Casts: NONE SEEN /lpf
Crystals: NONE SEEN
Squamous Epithelial / LPF: NONE SEEN /lpf

## 2010-09-24 NOTE — Progress Notes (Signed)
Summary: Med Clarification  Phone Note Other Incoming   Caller: Kennon Rounds from [facility] [phone]) Summary of Call: Kennon Rounds from [facility] called was wanting to get a few medication clarifed on her FL2. She said she had sent over a fax a few days ago with this information. Please give her a call back when you get a chance.  Initial call taken by: Harold Barban,  September 15, 2010 1:36 PM  Follow-up for Phone Call        Dr.Allexa Acoff- do you have another form on her. We are supposed to have 2 here. One has no meds on it. Army Fossa CMA  September 15, 2010 1:42 PM   Additional Follow-up for Phone Call Additional follow up Details #1::        Updated med list on FL2 according to pts med list w/ [place] and her hosptial d/c summary in computer. Added to meds to her list- Femera and Culturelle per hospital d/c summary.  Army Fossa CMA  September 16, 2010 7:53 AM      Additional Follow-up for Phone Call Additional follow up Details #2::    correction--- sher is not on femera any longer send n a print w/ or med list along w/ the papers I'm signing today Ilia Dimaano E. Chandlar Staebell MD  September 16, 2010 6:04 PM   Additional Follow-up for Phone Call Additional follow up Details #3:: Details for Additional Follow-up Action Taken: will fax a copy of med list. Army Fossa CMA  September 17, 2010 8:16 AM   New/Updated Medications: CULTURELLE 10 B CELL CAPS (LACTOBACILLUS RHAMNOSUS (GG)) 1 by mouth daily.

## 2010-09-24 NOTE — Progress Notes (Signed)
Summary: fyi - [facility] Note Other Incoming   Summary of Call: [facility] today 1st visit - will see her twice a week for 5 - 6  weeks Caller: [facility]  Follow-up for Phone Call        noted Follow-up by: Jalene Demo E. Ashlee Player MD,  September 14, 2010 5:28 PM

## 2010-09-30 NOTE — Miscellaneous (Signed)
Summary: Certification & Plan of Care, Face to Face Enounter/[facility]  Certification & Plan of Care, Face to Face Enounter/[facility]   Imported By: Maryln Gottron 09/22/2010 10:56:27  _____________________________________________________________________  External Attachment:    Type:   Image     Comment:   External Document

## 2010-09-30 NOTE — Miscellaneous (Signed)
Summary: Individualized Service Plan/[facility]  Individualized Service Plan/[facility]   Imported By: Maryln Gottron 09/22/2010 10:59:13  _____________________________________________________________________  External Attachment:    Type:   Image     Comment:   External Document

## 2010-09-30 NOTE — Miscellaneous (Signed)
Summary: Medication Clarification Requested/[facility]  Medication Clarification Requested/[facility]   Imported By: Maryln Gottron 09/22/2010 10:54:20  _____________________________________________________________________  External Attachment:    Type:   Image     Comment:   External Document

## 2010-09-30 NOTE — Assessment & Plan Note (Signed)
Summary: face to face to discuss PT at [facility], urine recheck   Vital Signs:  Patient profile:   77 year old female Weight:      125.38 pounds Pulse rate:   76 / minute Pulse rhythm:   regular BP sitting:   124 / 84  (left arm) Cuff size:   regular  Vitals Entered By: Army Fossa CMA (September 23, 2010 3:33 PM) CC: Face to Face evaluation for PT. Comments f/u on UTI from hospital.    History of Present Illness: since the of his visit, she was admitted to the hospital, records reviewed:    DATE OF ADMISSION:  08/13/2010   DATE OF DISCHARGE:  08/17/2010        PRIMARY DISCHARGE DIAGNOSES:   1. Status post mechanical fall.   2. Small foci of intracranial hemorrhage secondary to trauma.   3. Acute on chronic kidney disease.   4. Urinary tract infection.   5. Hypokalemia thought secondary to diuretic therapy.   6. Resolving hematoma and subsequent ecchymosis of the left frontal   region tracking down her left lower face as well.     labs reviewed 08-13-10 urine culture showed Klebsiella. Sensitive to ciprofloxacin 08-14-10 potassium 3.4, creatinine 1.2, CBC WBCs 9.9, hemoglobin 12.1, platelets 187  ROS she is here with her son-in-law they report that after she was at the hospital, she was transferred to a  rehabilitation unit and then 10 days ago to an assisted living facility [facility])  The patient has no recollection of the events Denies headaches, neck pain No nausea or vomiting no other falls since she left the hospital, needs order for physical therapy.   Current Medications (verified): 1)  Namenda 5 Mg Tabs (Memantine Hcl) .Marland Kitchen.. 1 By Mouth Two Times A Day 2)  Aricept 10 Mg Tabs (Donepezil Hydrochloride) .Marland Kitchen.. 1 By Mouth Once Daily 3)  Synthroid 75 Mcg Tabs (Levothyroxine Sodium) .Marland Kitchen.. 1 By Mouth Daily. 4)  Zyrtec 10 Mg Tabs (Cetirizine Hcl) .Marland Kitchen.. 1 By Mouth At Bedtime 5)  Triamterene-Hctz 37.5-25 Mg Caps (Triamterene-Hctz) .Marland Kitchen.. 1 By Mouth Once  Daily 6)  Bayer Low Strength 81 Mg Tbec (Aspirin) .Marland Kitchen.. 1 By Mouth Every Other Day 7)  Centrum Ultra Womens  Tabs (Multiple Vitamins-Minerals) .Marland Kitchen.. 1 By Mouth Once Daily 8)  Hectorol 0.5 Mcg Caps (Doxercalciferol) .Marland Kitchen.. 1 By Mouth Every M, W, F 9)  Imodium Advanced 2-125 Mg Tabs (Loperamide-Simethicone) .Marland Kitchen.. 1 Tab After Loose Stool Prn - No More Than 3/day 10)  Astepro 0.15 % Soln (Azelastine Hcl) .Marland Kitchen.. 1 Spray Each Nostril Once Daily As Needed Runny Nose 11)  Zoloft 50 Mg Tabs (Sertraline Hcl) .... 2 By Mouth Once Daily 12)  Tylenol 325 Mg Tabs (Acetaminophen) .... Take Two Tablets Every 4-6 Hours As Needed For Pain 13)  Calcium Carbonate 1250 Mg/51ml Susp (Calcium Carbonate) .... 5 Ml Daily 14)  Culturelle 10 B Cell Caps (Lactobacillus Rhamnosus (Gg)) .Marland Kitchen.. 1 By Mouth Daily.  Allergies (verified): 1)  ! Augmentin 2)  ! Sulfa  Past History:  Past Medical History: Reviewed history from 04/22/2010 and no changes required. COPD Hyperlipidemia Hypertension Dementia  Renal insufficiency  (creat 2008 was 1.8) Breast cancer, hx of Hyperthyroidism Osteopenia, last dexa aprox 2008 (per daugher) Osteoarthritis Depression  Past Surgical History: Reviewed history from 12/30/2008 and no changes required. Appendectomy Hysterectomy Tonsillectomy  Physical Exam  General:  alert and well-developed.  alert and well-developed.   Head:  face symmetric Neck:  frange of motion Lungs:  Normal respiratory effort, chest expands symmetrically. Lungs are clear to auscultation, no crackles or wheezes. Heart:  normal rate, regular rhythm, and no murmur.   Extremities:  no edema Walks with some difficulty  using a walker, definitely needs assistance to get off the chair and walk safely Psych:  pleasently  demented, no anxious or depressed   Impression & Recommendations:  Problem # 1:  GAIT DISTURBANCE (ICD-781.2) status post a fall, recovering well, definitely needs physical therapy, papers signed    Multiple medication lists from the hospital, our own records and a physical therapy reviewed. Will discontinue Zyrtec to prevent drowsiness and to decrease her risk of falls she if the allergies return, she may need a nasal spray  Problem # 2:  UTI (ICD-599.0) had a UTI @  the hospital, now asymptomatic. Recheck her urine Orders: UA Dipstick w/o Micro (automated)  (81003) T-Culture, Urine (04540-98119) T-Urine Microscopic ([id number])  Problem # 3:  OSTEOARTHRITIS (ICD-715.90) asx   Her updated medication list for this problem includes:    Bayer Low Strength 81 Mg Tbec (Aspirin) .Marland Kitchen... 1 by mouth every other day    Tylenol 325 Mg Tabs (Acetaminophen) .Marland Kitchen... Take two tablets every 4-6 hours as needed for pain  Problem # 4:  HYPERTHYROIDISM (ICD-242.90) labs Orders: Venipuncture (30865) TLB-TSH (Thyroid Stimulating Hormone) (84443-TSH) Specimen Handling (78469)  Labs Reviewed: TSH: 0.6 (06/02/2010)     Problem # 5:  RENAL INSUFFICIENCY (ICD-588.9) creatinine at the hospital was 1.8, at baseline  Complete Medication List: 1)  Namenda 5 Mg Tabs (Memantine hcl) .Marland Kitchen.. 1 by mouth two times a day 2)  Aricept 10 Mg Tabs (Donepezil hydrochloride) .Marland Kitchen.. 1 by mouth once daily 3)  Synthroid 75 Mcg Tabs (Levothyroxine sodium) .Marland Kitchen.. 1 by mouth daily. 4)  Triamterene-hctz 37.5-25 Mg Caps (Triamterene-hctz) .Marland Kitchen.. 1 by mouth once daily 5)  Bayer Low Strength 81 Mg Tbec (Aspirin) .Marland Kitchen.. 1 by mouth every other day 6)  Centrum Ultra Womens Tabs (Multiple vitamins-minerals) .Marland Kitchen.. 1 by mouth once daily 7)  Hectorol 0.5 Mcg Caps (Doxercalciferol) .Marland Kitchen.. 1 by mouth every m, w, f 8)  Imodium Advanced 2-125 Mg Tabs (Loperamide-simethicone) .Marland Kitchen.. 1 tab after loose stool prn - no more than 3/day 9)  Astepro 0.15 % Soln (Azelastine hcl) .Marland Kitchen.. 1 spray each nostril once daily as needed runny nose 10)  Zoloft 50 Mg Tabs (Sertraline hcl) .... 2 by mouth once daily 11)  Tylenol 325 Mg Tabs (Acetaminophen) .... Take two  tablets every 4-6 hours as needed for pain 12)  Calcium Carbonate 1250 Mg/52ml Susp (Calcium carbonate) .... 5 ml daily 13)  Culturelle 10 B Cell Caps (Lactobacillus rhamnosus (gg)) .Marland Kitchen.. 1 by mouth daily.  Patient Instructions: 1)  Please schedule a follow-up appointment in 3 to 4  months .    Orders Added: 1)  UA Dipstick w/o Micro (automated)  [81003] 2)  T-Culture, Urine [[id number]] 3)  T-Urine Microscopic [[id number]] 4)  Venipuncture [66440] 5)  TLB-TSH (Thyroid Stimulating Hormone) [84443-TSH] 6)  Specimen Handling [99000] 7)  Est. Patient Level IV [34742]    Laboratory Results   Urine Tests    Routine Urinalysis   Color: yellow Appearance: Clear Glucose: negative   (Normal Range: Negative) Bilirubin: negative   (Normal Range: Negative) Ketone: negative   (Normal Range: Negative) Spec. Gravity: <1.005   (Normal Range: 1.003-1.035) Blood: small   (Normal Range: Negative) pH: 6.0   (Normal Range: 5.0-8.0) Protein: negative   (Normal Range: Negative) Urobilinogen: 0.2   (Normal Range:  0-1) Nitrite: negative   (Normal Range: Negative) Leukocyte Esterace: trace   (Normal Range: Negative)    Comments: Army Fossa CMA  September 23, 2010 3:39 PM

## 2010-10-06 NOTE — Miscellaneous (Signed)
Summary: Certification and Plan of Care/[facility]  Certification and Plan of Care/[facility]   Imported By: Maryln Gottron 09/30/2010 14:18:10  _____________________________________________________________________  External Attachment:    Type:   Image     Comment:   External Document

## 2010-10-06 NOTE — Miscellaneous (Signed)
Summary: Physician Communication/[facility]  Physician Communication/[facility]   Imported By: Maryln Gottron 09/30/2010 14:16:02  _____________________________________________________________________  External Attachment:    Type:   Image     Comment:   External Document

## 2010-10-09 ENCOUNTER — Encounter: Payer: Self-pay | Admitting: Internal Medicine

## 2010-10-20 NOTE — Miscellaneous (Signed)
Summary: Physician's Orders/[facility]  Physician's Orders/[facility]   Imported By: Maryln Gottron 10/13/2010 10:25:05  _____________________________________________________________________  External Attachment:    Type:   Image     Comment:   External Document

## 2010-10-25 LAB — URINALYSIS, ROUTINE W REFLEX MICROSCOPIC
Bilirubin Urine: NEGATIVE
Glucose, UA: NEGATIVE mg/dL
Hgb urine dipstick: NEGATIVE
Ketones, ur: NEGATIVE mg/dL
Nitrite: POSITIVE — AB
Protein, ur: NEGATIVE mg/dL
Specific Gravity, Urine: 1.02 (ref 1.005–1.030)
Urobilinogen, UA: 0.2 mg/dL (ref 0.0–1.0)
pH: 6 (ref 5.0–8.0)

## 2010-10-25 LAB — URINE MICROSCOPIC-ADD ON

## 2010-10-25 LAB — URINE CULTURE

## 2010-11-10 LAB — BASIC METABOLIC PANEL
BUN: 33 mg/dL — ABNORMAL HIGH (ref 6–23)
CO2: 25 mEq/L (ref 19–32)
Chloride: 105 mEq/L (ref 96–112)
GFR calc non Af Amer: 22 mL/min — ABNORMAL LOW (ref >60)
Glucose, Bld: 149 mg/dL — ABNORMAL HIGH (ref 70–99)
Potassium: 3.9 mEq/L (ref 3.5–5.1)
Sodium: 144 mEq/L (ref 135–145)

## 2010-11-10 LAB — URINE CULTURE

## 2010-11-10 LAB — URINALYSIS, ROUTINE W REFLEX MICROSCOPIC
Glucose, UA: NEGATIVE mg/dL
Nitrite: NEGATIVE
Specific Gravity, Urine: 1.018 (ref 1.005–1.030)
pH: 6 (ref 5.0–8.0)

## 2010-11-10 LAB — URINE MICROSCOPIC-ADD ON

## 2010-11-10 LAB — CBC
HCT: 45 % (ref 36.0–46.0)
Hemoglobin: 14.8 g/dL (ref 12.0–15.0)
MCV: 89.3 fL (ref 78.0–100.0)
RDW: 13.2 % (ref 11.5–15.5)

## 2010-11-10 LAB — DIFFERENTIAL
Basophils Absolute: 0.2 10*3/uL — ABNORMAL HIGH (ref 0.0–0.1)
Eosinophils Absolute: 0.4 10*3/uL (ref 0.0–0.7)
Eosinophils Relative: 4 % (ref 0–5)
Lymphocytes Relative: 8 % — ABNORMAL LOW (ref 12–46)
Monocytes Absolute: 0.9 10*3/uL (ref 0.1–1.0)

## 2010-11-10 LAB — POCT CARDIAC MARKERS: Troponin i, poc: <0.05 ng/mL (ref 0.00–0.09)

## 2010-12-25 NOTE — Discharge Summary (Signed)
NAMEFRANCIES, [name]                ACCOUNT NO.:  [id number]   MEDICAL RECORD NO.:  [id number]          PATIENT TYPE:  OIB   LOCATION:  5729                         FACILITY:  [facility]   PHYSICIAN:  Anselm Pancoast. Weatherly, M.D.DATE OF BIRTH:  10-03-1922   DATE OF ADMISSION:  06/24/2005  DATE OF DISCHARGE:  06/25/2005                                 DISCHARGE SUMMARY   DISCHARGE DIAGNOSES:  Carcinoma of the left breast, stage I.   OPERATION:  Lumpectomy and left axillary dissection with general anesthesia.   HISTORY OF PRESENT ILLNESS:  [name] is an 77 year old Caucasian  female who was referred to my office by the [facility]  after a screening mammogram had shown a lesion in the left breast.  She was  referred to the [facility] where a percutaneous biopsy was obtained,  which showed invasive carcinoma.  She was scheduled for surgery yesterday  for a lumpectomy and sentinel node.   At the time of surgery the sentinel node was not initially found, and we  went ahead and proceeded with a left axillary dissection.  She does have a  Jackson-Pratt drain.  The lumpectomy did nicely and she has had various  minimal pain postoperatively.  The patient is at a retirement home and self  independent living.  They want to move her to the infirmary for the first  three to four days to check on her.  I was informed that she would need a  priority discharge summary, as the patient was supposedly leaving the  hospital.   CHRONIC MEDICATIONS:  1.  Zoloft 50 mg h.s.  2.  Lasix 40 mg daily.  3.  Cardizem CD 180 mg p.o. daily.  4.  Diovan 160 mg p.o. daily.  5.  Hydrochlorothiazide 1.5 mg p.o. on Monday, Wednesday and Friday.  6.  __________ 10 mg p.o. daily.  7.  Synthroid 75 mcg daily.  8.  Multivitamins.  9.  I also want the patient on Keflex 500 mg b.i.d. until her Jackson-Pratt      drain is removed.  10. Darvocet-N 100 for incisional pain.   DISCHARGE  INSTRUCTIONS:  The nurses are to empty the Jackson-Pratt drain and  record the drainage, so that the patient when she comes to our office on  Tuesday, we can make a correct decision on whether to remove the [place] drain or not.  The dressings have been changed today.  She is having  minimal pain.   Hopefully the patient will be ready to go back to her independent living  status in two to three days.  It is not necessary that she stay in the  infirmary until she is seen in the office, but if they elect to keep her  there until after the drain is removed, that is their decision.   The pathology report is pending.  This hopefully is an early breast cancer  and the remaining treatment will be decided on the results after the  pathology report has been completed.   The discharge summary will need to go  with the patient to the retirement  community.  She would also like to have a copy go to Dr. Pam Drown at  the [facility].           ______________________________  Anselm Pancoast. Zachery Dakins, M.D.     WJW/MEDQ  D:  06/25/2005  T:  06/25/2005  Job:  [id number]   cc:   Pam Drown, M.D.  Fax: [phone]   [facility]irement Center

## 2010-12-25 NOTE — Op Note (Signed)
Claudia [name], Claudia [name]                ACCOUNT NO.:  [id number]   MEDICAL RECORD NO.:  [id number]          PATIENT TYPE:  OIB   LOCATION:  5729                         FACILITY:  [facility]   PHYSICIAN:  Anselm Pancoast. Weatherly, M.D.DATE OF BIRTH:  1922/11/23   DATE OF PROCEDURE:  06/24/2005  DATE OF DISCHARGE:                                 OPERATIVE REPORT   PREOPERATIVE DIAGNOSIS:  Carcinoma of the left breast.   POSTOPERATIVE DIAGNOSIS:  Carcinoma of the left breast.   OPERATION:  Partial mastectomy and sentinel node biopsy, left axillary  dissection.   ANESTHESIA:  General.   SURGEON:  Anselm Pancoast. Zachery Dakins, M.D.   HISTORY:  Claudia [name] is an 77 year old female who was referred to me after  she had had a mammogram screening at [facility] that showed a suspicious  small round area in the left breast.  I [facility] to the [facility]  where they did an ultrasound guided core biopsy with findings that this was  an invasive carcinoma.  She has a little hematoma at the area on the  mammogram and the ultrasound that is right at the 9 o'clock position and you  can see the little bruise around the area and I thought that it was not  necessary to re-localize the area for the partial mastectomy and sentinel  node.  She was injected this morning and then taken to the operating suite.   DESCRIPTION OF PROCEDURE:  The breast was prepped with Betadine solution and  draped in a sterile manner.  Then, I injected the methylene blue up under  the areola, about 4-5 mL.  Next, using a Geiger counter in the axilla, there  was one little area that had a reading of about 6 or 8, not very high, and  instead of doing the sentinel node first, I went ahead and did the partial  mastectomy, made a transverse incision, and then dissected out and really  removed all the breast tissue from the medial aspect to the mediastinum from  approximately 2 o'clock to probably 6:30 o'clock position.  This is the  smaller flat medial aspect and good hemostasis was obtained with cautery and  a few sutures of 4-0 Vicryl.  I could feel a little thickening in the area  and then I sent the specimen to be radiographed and they thing that the area  is in it, they tried to do an ultrasound, but they said it was confusing on  the orientation, but they were confident that the area was in the specimen.  Next, we directed our attention to the sentinel node and the count was now a  little higher, would get to about 20 where she has got about a 500 or 600  directed towards the breast.  A little transverse incision over the area,  dissected down to the lateral edge of the pectoralis major and then  dissecting in several areas which would get a warm count, not a real high  count, and 4-5 little pieces of fatty tissue, possibly little lymph nodes,  were removed that really had mostly a  0 count.  I probably spent 45 minutes  dissecting and obviously not finding a sentinel node, I elected to go ahead  and do an axillary dissection.  Extending the incision a little bit and then  went up to the vein and swept the area and very medial, very high in the  axilla, there was a larger node that was blue and later had a count of about  50, it was in a very high, atypical location, but this is most likely the  sentinel node.  All the axillary contents were sent and they did test the  sentinel node and it appears to be negative.  I did not pick all the  remaining fat and axillary contents, but they will probably be ten nodes I  expect in this partial axillary dissection.  Hemostasis was obtained with a  few clips and some sutures and ties of 4-0 Vicryl and 4-0 Vicryl in the  subcutaneous tissue and staples on the skin.  I placed a 10 Jackson-Pratt  drain in the incision and it was sutured to the skin with 3-0 nylon.  The  lumpectomy incision had been closed with 4-0 Vicryl after some  4-0 Monocryl and then Steri-Strips and Benzoin  on the actual breast  incision.  The patient tolerated the procedure nicely and was sent to the  recovery room in a stable postop condition.  Arrangements have been made for  her to spend the night and then she will be in the infirmary for the  extended care for the retirement home she is in.           ______________________________  Anselm Pancoast. Zachery Dakins, M.D.     WJW/MEDQ  D:  06/24/2005  T:  06/24/2005  Job:  [id number]   cc:   Pam Drown, M.D.  Fax: [phone]

## 2010-12-29 ENCOUNTER — Encounter: Payer: Self-pay | Admitting: Internal Medicine

## 2010-12-29 ENCOUNTER — Other Ambulatory Visit (HOSPITAL_BASED_OUTPATIENT_CLINIC_OR_DEPARTMENT_OTHER): Payer: Medicare Other

## 2010-12-29 ENCOUNTER — Ambulatory Visit (INDEPENDENT_AMBULATORY_CARE_PROVIDER_SITE_OTHER): Payer: Medicare Other | Admitting: Internal Medicine

## 2010-12-29 ENCOUNTER — Ambulatory Visit (INDEPENDENT_AMBULATORY_CARE_PROVIDER_SITE_OTHER)
Admission: RE | Admit: 2010-12-29 | Discharge: 2010-12-29 | Disposition: A | Discharge status: Home / Self Care | Payer: Medicare Other | Source: Ambulatory Visit | Attending: Internal Medicine | Admitting: Internal Medicine

## 2010-12-29 ENCOUNTER — Encounter: Payer: Self-pay | Admitting: *Deleted

## 2010-12-29 ENCOUNTER — Ambulatory Visit (HOSPITAL_BASED_OUTPATIENT_CLINIC_OR_DEPARTMENT_OTHER)
Admission: RE | Admit: 2010-12-29 | Discharge: 2010-12-29 | Disposition: A | Discharge status: Home / Self Care | Payer: Medicare Other | Source: Ambulatory Visit | Attending: Internal Medicine | Admitting: Internal Medicine

## 2010-12-29 VITALS — BP 124/72 | HR 72 | Wt 125.0 lb

## 2010-12-29 DIAGNOSIS — L0390 Cellulitis, unspecified: Secondary | ICD-10-CM

## 2010-12-29 DIAGNOSIS — E039 Hypothyroidism, unspecified: Secondary | ICD-10-CM

## 2010-12-29 DIAGNOSIS — L989 Disorder of the skin and subcutaneous tissue, unspecified: Secondary | ICD-10-CM

## 2010-12-29 DIAGNOSIS — R600 Localized edema: Secondary | ICD-10-CM

## 2010-12-29 DIAGNOSIS — M7989 Other specified soft tissue disorders: Secondary | ICD-10-CM | POA: Insufficient documentation

## 2010-12-29 DIAGNOSIS — M899 Disorder of bone, unspecified: Secondary | ICD-10-CM | POA: Insufficient documentation

## 2010-12-29 DIAGNOSIS — L0291 Cutaneous abscess, unspecified: Secondary | ICD-10-CM

## 2010-12-29 DIAGNOSIS — M79609 Pain in unspecified limb: Secondary | ICD-10-CM | POA: Insufficient documentation

## 2010-12-29 DIAGNOSIS — R609 Edema, unspecified: Secondary | ICD-10-CM

## 2010-12-29 DIAGNOSIS — E0590 Thyrotoxicosis, unspecified without thyrotoxic crisis or storm: Secondary | ICD-10-CM

## 2010-12-29 DIAGNOSIS — M949 Disorder of cartilage, unspecified: Secondary | ICD-10-CM | POA: Insufficient documentation

## 2010-12-29 MED ORDER — DOXYCYCLINE HYCLATE 100 MG PO TABS: 100.0000 mg | ORAL_TABLET | Freq: Two times a day (BID) | ORAL | Status: AC

## 2010-12-29 NOTE — Assessment & Plan Note (Signed)
Skin lesions on the left foot, this could be a dried blister but I am also concerned about possible BCC. Reassess when she comes back.

## 2010-12-29 NOTE — Assessment & Plan Note (Addendum)
In preparation for the visit, we ordered a ultrasound, it was negative for DVT. The patient has cellulitis, no obvious port of entry except for the mild excoriations in the pretibial area. The patient is quite tender at the bottom of the heel however as there is no evidence of abscess. Plan: X-rays Antibiotics Reassess in 2 weeks See instructions

## 2010-12-29 NOTE — Patient Instructions (Signed)
Labs XR Elevate leg Call if redness gets worse, you have fever or no better in 3-4 days

## 2010-12-29 NOTE — Progress Notes (Signed)
  Subjective:    Patient ID: Claudia [name], female    DOB: [birth year], 77 y.o.   MRN: [id number]  HPI Here with her daughter, about a week ago the patient noted some swelling and pain in the right foot. Does not recall any injury. On today's exam, I also noted a ulcer at the lateral aspect of the left foot. Patient was unaware of the lesion  until today , does not recall how or when the ulcer started  Past Medical History  Diagnosis Date  . COPD (chronic obstructive pulmonary disease)   . Hyperlipidemia   . Hypertension   . Dementia   . Renal insufficiency   . Breast cancer   . Hypothyroidism   . Osteopenia     last dexa aprox 2008- per daughter  . Osteoarthritis   . Depression    Past Surgical History  Procedure Date  . Appendectomy   . Abdominal hysterectomy   . Tonsillectomy      Review of Systems Denies any fever or chills No chest pain or shortness of breath No recent falls. Medication list is reviewed and accurate to my knowledge.     Objective:   Physical Exam  Constitutional: She appears well-developed and well-nourished.  Cardiovascular: Normal rate, regular rhythm and normal heart sounds.   No murmur heard. Pulmonary/Chest: Effort normal and breath sounds normal. No respiratory distress. She has no wheezes. She has no rales.  Musculoskeletal:       Feet:  Skin:      right leg, see graphics, findings consistent with scellulitis of the right foot and distal pretibial area. The heel is quite tender without fluctuance. Skin is intact except for few excoriations in the pretibial area; skin is noted to be extremely dry. no maceration between the toes.       Assessment & Plan:

## 2010-12-29 NOTE — Assessment & Plan Note (Signed)
Due for  TSH, patient's daughter prefers to check the TSH in 2 weeks when she comes back

## 2011-01-11 ENCOUNTER — Ambulatory Visit (INDEPENDENT_AMBULATORY_CARE_PROVIDER_SITE_OTHER): Payer: Medicare Other | Admitting: Internal Medicine

## 2011-01-11 ENCOUNTER — Encounter: Payer: Self-pay | Admitting: Internal Medicine

## 2011-01-11 VITALS — BP 124/80 | HR 108 | Wt 116.2 lb

## 2011-01-11 DIAGNOSIS — L989 Disorder of the skin and subcutaneous tissue, unspecified: Secondary | ICD-10-CM

## 2011-01-11 DIAGNOSIS — L0291 Cutaneous abscess, unspecified: Secondary | ICD-10-CM

## 2011-01-11 DIAGNOSIS — L0390 Cellulitis, unspecified: Secondary | ICD-10-CM

## 2011-01-11 DIAGNOSIS — E0590 Thyrotoxicosis, unspecified without thyrotoxic crisis or storm: Secondary | ICD-10-CM

## 2011-01-11 DIAGNOSIS — E039 Hypothyroidism, unspecified: Secondary | ICD-10-CM

## 2011-01-11 MED ORDER — MUPIROCIN CALCIUM 2 % EX CREA: TOPICAL_CREAM | CUTANEOUS | Status: DC

## 2011-01-11 NOTE — Assessment & Plan Note (Addendum)
Previously seen skin lesion of the left foot unchanged. On further questioning, the family noted it about 3 weeks ago and it looked like a blood blister. Did not look like a mole. Plan: bactroban for 10 days, observation, if not getting better we will need a dermatology referral

## 2011-01-11 NOTE — Progress Notes (Signed)
  Subjective:    Patient ID: Claudia [name], female    DOB: 1922/12/14, 77 y.o.   MRN: [id number]  HPI Followup from previous visit, doing well.  Past Medical History  Diagnosis Date  . COPD (chronic obstructive pulmonary disease)   . Hyperlipidemia   . Hypertension   . Dementia   . Renal insufficiency   . Breast cancer   . Hypothyroidism   . Osteopenia     last dexa aprox 2008- per daughter  . Osteoarthritis   . Depression    Past Surgical History  Procedure Date  . Appendectomy   . Abdominal hysterectomy   . Tonsillectomy       Review of Systems Denies any problem with the prescribed antibiotic, no nausea, vomiting, diarrhea Denies any major discomfort at the area of cellulitis.     Objective:   Physical Exam Alert, no apparent distress. See previous note, redness at the dorsum of the right foot and heel better, no swelling there. Proximal to the right ankle she does have trace edema. Left foot: Skin lesion unchanged.       Assessment & Plan:

## 2011-01-11 NOTE — Patient Instructions (Signed)
Apply the cream to the L foot x 10 days Keep legs elevated  Call if problems

## 2011-01-11 NOTE — Assessment & Plan Note (Signed)
Checking a TSH.   

## 2011-01-11 NOTE — Assessment & Plan Note (Signed)
Cellulitis is better, she does have some swelling proximal to the right ankle that is mild. Recommend observation and  leg elevation.

## 2011-01-12 ENCOUNTER — Telehealth: Payer: Self-pay | Admitting: *Deleted

## 2011-01-12 MED ORDER — AMBULATORY NON FORMULARY MEDICATION: Status: DC

## 2011-01-12 NOTE — Telephone Encounter (Signed)
Needs new rx for Culturelle Priobiotic.

## 2011-05-03 ENCOUNTER — Telehealth: Payer: Self-pay | Admitting: Internal Medicine

## 2011-05-03 NOTE — Telephone Encounter (Signed)
Spoke w/[facility] and gave instructions; faxed order for Cipro to [phone]

## 2011-05-03 NOTE — Telephone Encounter (Signed)
i received a UCX: G- rods, sensitivity pending plan:  Ciprofloxacin 250 mg 1 by mouth twice a day for one week. They need to call me if she has fever, nausea, vomiting, difficulty urinating. Urine culture in 3 weeks

## 2011-05-03 NOTE — Telephone Encounter (Signed)
Patient daughter Maralyn Sago) called  - if med is going to be called in -call in to [facility] - [phone]

## 2011-05-06 ENCOUNTER — Encounter: Payer: Self-pay | Admitting: Internal Medicine

## 2011-06-14 ENCOUNTER — Encounter (HOSPITAL_BASED_OUTPATIENT_CLINIC_OR_DEPARTMENT_OTHER): Payer: Self-pay | Admitting: *Deleted

## 2011-06-14 ENCOUNTER — Emergency Department (HOSPITAL_BASED_OUTPATIENT_CLINIC_OR_DEPARTMENT_OTHER)
Admission: EM | Admit: 2011-06-14 | Discharge: 2011-06-14 | Disposition: A | Discharge status: Home / Self Care | Payer: Medicare Other | Attending: Emergency Medicine | Admitting: Emergency Medicine

## 2011-06-14 ENCOUNTER — Emergency Department (INDEPENDENT_AMBULATORY_CARE_PROVIDER_SITE_OTHER): Payer: Medicare Other

## 2011-06-14 DIAGNOSIS — M7989 Other specified soft tissue disorders: Secondary | ICD-10-CM

## 2011-06-14 DIAGNOSIS — E785 Hyperlipidemia, unspecified: Secondary | ICD-10-CM | POA: Insufficient documentation

## 2011-06-14 DIAGNOSIS — M79609 Pain in unspecified limb: Secondary | ICD-10-CM

## 2011-06-14 DIAGNOSIS — Z79899 Other long term (current) drug therapy: Secondary | ICD-10-CM | POA: Insufficient documentation

## 2011-06-14 DIAGNOSIS — W19XXXA Unspecified fall, initial encounter: Secondary | ICD-10-CM

## 2011-06-14 DIAGNOSIS — J4489 Other specified chronic obstructive pulmonary disease: Secondary | ICD-10-CM | POA: Insufficient documentation

## 2011-06-14 DIAGNOSIS — E079 Disorder of thyroid, unspecified: Secondary | ICD-10-CM | POA: Insufficient documentation

## 2011-06-14 DIAGNOSIS — I10 Essential (primary) hypertension: Secondary | ICD-10-CM | POA: Insufficient documentation

## 2011-06-14 DIAGNOSIS — N390 Urinary tract infection, site not specified: Secondary | ICD-10-CM

## 2011-06-14 DIAGNOSIS — M11869 Other specified crystal arthropathies, unspecified knee: Secondary | ICD-10-CM

## 2011-06-14 DIAGNOSIS — Y92009 Unspecified place in unspecified non-institutional (private) residence as the place of occurrence of the external cause: Secondary | ICD-10-CM | POA: Insufficient documentation

## 2011-06-14 DIAGNOSIS — F0390 Unspecified dementia without behavioral disturbance: Secondary | ICD-10-CM | POA: Insufficient documentation

## 2011-06-14 DIAGNOSIS — J449 Chronic obstructive pulmonary disease, unspecified: Secondary | ICD-10-CM | POA: Insufficient documentation

## 2011-06-14 DIAGNOSIS — T148XXA Other injury of unspecified body region, initial encounter: Secondary | ICD-10-CM

## 2011-06-14 DIAGNOSIS — W010XXA Fall on same level from slipping, tripping and stumbling without subsequent striking against object, initial encounter: Secondary | ICD-10-CM | POA: Insufficient documentation

## 2011-06-14 DIAGNOSIS — Z0389 Encounter for observation for other suspected diseases and conditions ruled out: Secondary | ICD-10-CM | POA: Insufficient documentation

## 2011-06-14 LAB — URINE MICROSCOPIC-ADD ON

## 2011-06-14 LAB — URINALYSIS, ROUTINE W REFLEX MICROSCOPIC
Bilirubin Urine: NEGATIVE
Nitrite: NEGATIVE
Protein, ur: NEGATIVE mg/dL
Specific Gravity, Urine: 1.017 (ref 1.005–1.030)
Urobilinogen, UA: 0.2 mg/dL (ref 0.0–1.0)

## 2011-06-14 MED ORDER — CIPROFLOXACIN HCL 500 MG PO TABS: 500.0000 mg | ORAL_TABLET | Freq: Two times a day (BID) | ORAL | Status: AC

## 2011-06-14 NOTE — ED Provider Notes (Addendum)
History     CSN: [id number] Arrival date & time: 06/14/2011 10:00 AM   First MD Initiated Contact with Patient 06/14/11 [phone]      Chief Complaint  Patient presents with  . Fall    (Consider location/radiation/quality/duration/timing/severity/associated sxs/prior treatment) HPI Comments: Slid out of bed 2 days ago without injury but daughter has noticed her leg was swollen   Patient is a 77 y.o. female presenting with fall. The history is provided by the patient and the nursing home.  Fall The accident occurred less than 1 hour ago. The fall occurred while walking (was not using her walker). She landed on carpet. The point of impact was the left knee. Pain location: no pain. The pain is at a severity of 0/10. The patient is experiencing no pain. She was ambulatory at the scene. Pertinent negatives include no fever, no abdominal pain, no vomiting and no headaches. She has tried nothing for the symptoms.    Past Medical History  Diagnosis Date  . COPD (chronic obstructive pulmonary disease)   . Hyperlipidemia   . Hypertension   . Dementia   . Renal insufficiency   . Breast cancer   . Hypothyroidism   . Osteopenia     last dexa aprox 2008- per daughter  . Osteoarthritis   . Depression     Past Surgical History  Procedure Date  . Appendectomy   . Abdominal hysterectomy   . Tonsillectomy     History reviewed. No pertinent family history.  History  Substance Use Topics  . Smoking status: Former Games developer  . Smokeless tobacco: Not on file  . Alcohol Use: Yes     socially    OB History    Grav Para Term Preterm Abortions TAB SAB Ect Mult Living                  Review of Systems  Constitutional: Negative for fever.  Gastrointestinal: Negative for vomiting and abdominal pain.  Neurological: Negative for headaches.       Dementia and difficulty with short term memory  All other systems reviewed and are negative.    Allergies  JXB:JYNWGNFAOZH+YQMVHQION+GEXBMWUXLK  acid+aspartame and Sulfonamide derivatives  Home Medications   Current Outpatient Rx  Name Route Sig Dispense Refill  . ACETAMINOPHEN 325 MG PO TABS Oral Take 650 mg by mouth every 6 (six) hours as needed.      . AMBULATORY NON FORMULARY MEDICATION  Culturelle Probiotic Supplement with natural dairy free lactobacillus capsule. Take 1 tab once daily for 90 days, w/ 3 refills. 90 capsule 3  . ASPIRIN 81 MG PO TABS Oral Take 81 mg by mouth daily.      . AZELASTINE HCL 0.15 % NA SOLN Nasal 1 spray by Nasal route daily.      Marland Kitchen CALCIUM CARBONATE 1250 MG/5ML PO SUSP Oral Take 500 mg of elemental calcium by mouth daily.      Marland Kitchen CETIRIZINE HCL 10 MG PO TABS Oral Take 10 mg by mouth daily.      Marland Kitchen CIPROFLOXACIN HCL 500 MG PO TABS Oral Take 1 tablet (500 mg total) by mouth 2 (two) times daily. 20 tablet 0  . DONEPEZIL HCL 10 MG PO TABS Oral Take 10 mg by mouth at bedtime.      Marland Kitchen DOXERCALCIFEROL 0.5 MCG PO CAPS  1 po M,W,F.     . LACTOBACILLUS RHAMNOSUS (GG) 10 B CELL PO CAPS Oral Take 1 capsule by mouth daily.      Marland Kitchen  LEVOTHYROXINE SODIUM 75 MCG PO TABS Oral Take 75 mcg by mouth daily.      Marland Kitchen LOPERAMIDE-SIMETHICONE 2-125 MG PO TABS Oral Take by mouth as needed. No more than 3/day     . MEMANTINE HCL 5 MG PO TABS Oral Take 5 mg by mouth 2 (two) times daily.      . CENTRUM ULTRA WOMENS PO Oral Take by mouth.      . MUPIROCIN CALCIUM 2 % EX CREA  Apply to affected area 2  times daily x 10 days 30 g 0  . SERTRALINE HCL 50 MG PO TABS Oral Take 100 mg by mouth daily.      . TRIAMTERENE-HCTZ 37.5-25 MG PO CAPS Oral Take 1 capsule by mouth daily.        BP 118/76  Pulse 82  Temp(Src) 99.2 F (37.3 C) (Oral)  Resp 20  SpO2 98%  Physical Exam  Nursing note and vitals reviewed. Constitutional: She appears well-developed and well-nourished. No distress.  HENT:  Head: Normocephalic and atraumatic.  Eyes: EOM are normal. Pupils are equal, round, and reactive to light.  Neck: Normal range of motion. Neck  supple.  Cardiovascular: Normal rate, regular rhythm, normal heart sounds and intact distal pulses.  Exam reveals no friction rub.   No murmur heard. Pulmonary/Chest: Effort normal and breath sounds normal. She has no wheezes. She has no rales.  Abdominal: Soft. Bowel sounds are normal. She exhibits no distension. There is no tenderness. There is no rebound and no guarding.  Musculoskeletal: Normal range of motion. She exhibits no tenderness.       Left knee: She exhibits no swelling and no effusion. no tenderness found.       Right lower leg: Normal.       Left lower leg: She exhibits tenderness, swelling and edema. She exhibits no deformity.       Legs:      Large ecchymosis and edema over the left lower leg  Neurological: She is alert. No cranial nerve deficit.  Skin: Skin is warm and dry. No rash noted.  Psychiatric: She has a normal mood and affect. Her behavior is normal.    ED Course  Procedures (including critical care time)  Labs Reviewed  URINALYSIS, ROUTINE W REFLEX MICROSCOPIC - Abnormal; Notable for the following:    Appearance CLOUDY (*)    Leukocytes, UA SMALL (*)    All other components within normal limits  URINE MICROSCOPIC-ADD ON - Abnormal; Notable for the following:    Bacteria, UA MANY (*)    All other components within normal limits  URINE CULTURE   Dg Tibia/fibula Left  06/14/2011  *RADIOLOGY REPORT*  Clinical Data: Laceration, pain, tenderness, redness, soreness, fall  LEFT TIBIA AND FIBULA - 2 VIEW  Comparison: None  Findings: Osseous demineralization. Mild scattered vascular calcification. Ankle mortise intact. No acute fracture, dislocation or bone destruction. No soft tissue gas or radiopaque foreign body.  IMPRESSION: No acute abnormalities.  Original Report Authenticated By: Lollie Marrow, M.D.   US Venous Img Lower Unilateral Left  06/14/2011  *RADIOLOGY REPORT*  Clinical Data: Swelling and discoloration post fall.  LEFT LOWER EXTREMITY VENOUS DOPPLER  ULTRASOUND  Technique: Gray-scale sonography with compression, as well as color and duplex ultrasound, were performed to evaluate the deep venous system from the level of the common femoral vein through the popliteal and proximal calf veins.  Comparison: None  Findings:  Normal compressibility of  the common femoral, superficial femoral, and popliteal  veins, as well as the proximal calf veins.  No filling defects to suggest DVT on grayscale or color Doppler imaging.  Doppler waveforms show normal direction of venous flow, normal respiratory phasicity and response to augmentation. There is subcutaneous edema in the calf.  IMPRESSION: No evidence of  lower extremity deep vein thrombosis.  Original Report Authenticated By: Osa Craver, M.D.   Dg Knee Complete 4 Views Left  06/14/2011  *RADIOLOGY REPORT*  Clinical Data: Laceration, sore, redness, fall  LEFT KNEE - COMPLETE 4+ VIEW  Comparison: None  Findings: Diffuse osseous demineralization. Joint space narrowing. Regional soft tissue swelling. No acute fracture, dislocation, or bone destruction. Mild chondrocalcinosis consistent with CPPD/pseudogout. Scattered atherosclerotic calcifications. No knee joint effusion.  IMPRESSION: Degenerative changes with CPPD/pseudogout. Osseous demineralization. No acute bony abnormalities.  Original Report Authenticated By: Lollie Marrow, M.D.     1. UTI (lower urinary tract infection)   2. Fall       MDM   Pt with mechanical fall due to not using walking with superficial abrasion to the left knee.  However left lower leg with bruising and edema.  Family states she slid out of bed 2 days ago but was not evaluated.  Pt does have some mild calf pain and due to the swelling and discoloration will get plain film and doppler to r/o DVT.  Plain film and doppler neg.  Will have pt elevate the leg to improve the swelling.  No LOC or head injury and not on anticoagulation.   Pt had UA sent due to looking questionable  despite no symptoms but UA suggestive of UTI and was placed on cipro and culture sent.        Gwyneth Sprout, MD 06/14/11 1416  Gwyneth Sprout, MD 06/14/11 1428

## 2011-06-14 NOTE — ED Notes (Signed)
Pt to room 4 by ems via stretcher. Pt is a/a/ox4, smiling and denies any injuries or pain, denies any c/o.  Pt reports another fall on Saturday resulting in skin tear to left leg, no new injury observed today per ems.

## 2011-06-15 ENCOUNTER — Encounter: Payer: Self-pay | Admitting: Internal Medicine

## 2011-06-16 LAB — URINE CULTURE: Culture  Setup Time: 201211060551

## 2011-07-09 ENCOUNTER — Ambulatory Visit: Payer: Medicare Other | Admitting: Internal Medicine

## 2011-07-12 ENCOUNTER — Encounter: Payer: Self-pay | Admitting: Internal Medicine

## 2011-07-13 ENCOUNTER — Ambulatory Visit (INDEPENDENT_AMBULATORY_CARE_PROVIDER_SITE_OTHER): Payer: Medicare Other | Admitting: Internal Medicine

## 2011-07-13 ENCOUNTER — Encounter: Payer: Self-pay | Admitting: Internal Medicine

## 2011-07-13 VITALS — BP 130/76 | HR 83 | Temp 98.3°F | Wt 128.8 lb

## 2011-07-13 DIAGNOSIS — I739 Peripheral vascular disease, unspecified: Secondary | ICD-10-CM

## 2011-07-13 DIAGNOSIS — S8010XA Contusion of unspecified lower leg, initial encounter: Secondary | ICD-10-CM

## 2011-07-13 DIAGNOSIS — S8012XA Contusion of left lower leg, initial encounter: Secondary | ICD-10-CM

## 2011-07-13 DIAGNOSIS — E0590 Thyrotoxicosis, unspecified without thyrotoxic crisis or storm: Secondary | ICD-10-CM

## 2011-07-13 DIAGNOSIS — N259 Disorder resulting from impaired renal tubular function, unspecified: Secondary | ICD-10-CM

## 2011-07-13 MED ORDER — ACETAMINOPHEN 325 MG PO TABS: 325.0000 mg | ORAL_TABLET | Freq: Every day | ORAL | Status: DC

## 2011-07-13 NOTE — Assessment & Plan Note (Signed)
Due for labs

## 2011-07-13 NOTE — Assessment & Plan Note (Signed)
I am somehow concerned about peripheral vascular disease given decreased pulses. Will check ABI to evaluate the problem

## 2011-07-13 NOTE — Assessment & Plan Note (Addendum)
Persistent left leg swelling after a contusion a month ago. Previous x-rays and ultrasound ---> no fracture or DVT. At this point I doubt she has a DVT given the fact that the swelling is not worse. She does have some evidence of decreased circulation although she is asymptomatic. I discussed the situation with the patient and her daughter, we agreed to observe for a few more weeks, if area is not getting better they will call for orthopedic referral.

## 2011-07-13 NOTE — Progress Notes (Signed)
  Subjective:    Patient ID: Claudia [name], female    DOB: [birth year], 77 y.o.   MRN: [id number]  HPI Claudia [name] to the ER 06/14/2011 after she fell and injured her left leg. Chart reviewed, x-rays of the leg were negative, ultrasound negative for DVT, urinalyses show a UTI however urine culture was negative. She did not sustain any head injury during the fall, the daughter brought Claudia [name] today because the leg continue to be swollen. Pt denies any pain in the area except when somebody touches it, no distal pain from the site of injury.  Hypothyroidism--good medication compliance Hypertension--good medication compliance  Past Medical History  Diagnosis Date  . COPD (chronic obstructive pulmonary disease)   . Hyperlipidemia   . Hypertension   . Dementia   . Renal insufficiency   . Breast cancer   . Hypothyroidism   . Osteopenia     last dexa aprox 2008- per daughter  . Osteoarthritis   . Depression    Past Surgical History  Procedure Date  . Appendectomy   . Abdominal hysterectomy   . Tonsillectomy      Review of Systems No chest pain or shortness of breath No nausea, vomiting, diarrhea No claudication per se when she walks with a walker.(she is able to go further than 1/2 block) Needs a change in the prescription of tylenol. See medication list      Objective:   Physical Exam  Constitutional: She appears well-developed and well-nourished.  Cardiovascular: Normal rate, regular rhythm and normal heart sounds.   No murmur heard.      Pedal and  femoral pulses decreased to palpation. Capillary refill in all toes present   Pulmonary/Chest: Effort normal and breath sounds normal. No respiratory distress. She has no wheezes. She has no rales.  Musculoskeletal:       Legs:      Right leg with trace pretibial edema, calf soft. Left leg: See graphic, calf slt larger than the R but no tender           Assessment & Plan:

## 2011-07-13 NOTE — Assessment & Plan Note (Signed)
Due for lab

## 2011-07-14 LAB — TSH: TSH: 35.47 u[IU]/mL — ABNORMAL HIGH (ref 0.35–5.50)

## 2011-07-14 LAB — CBC WITH DIFFERENTIAL/PLATELET
Basophils Absolute: 0 10*3/uL (ref 0.0–0.1)
Eosinophils Relative: 5.6 % — ABNORMAL HIGH (ref 0.0–5.0)
Lymphocytes Relative: 19.9 % (ref 12.0–46.0)
Monocytes Relative: 10.4 % (ref 3.0–12.0)
Neutrophils Relative %: 63.7 % (ref 43.0–77.0)
Platelets: 199 10*3/uL (ref 150.0–400.0)
RDW: 16.2 % — ABNORMAL HIGH (ref 11.5–14.6)
WBC: 8.6 10*3/uL (ref 4.5–10.5)

## 2011-07-14 LAB — BASIC METABOLIC PANEL
BUN: 22 mg/dL (ref 6–23)
Calcium: 9.6 mg/dL (ref 8.4–10.5)
Creatinine, Ser: 1.7 mg/dL — ABNORMAL HIGH (ref 0.4–1.2)
GFR: 31.28 mL/min — ABNORMAL LOW (ref >60.00)
Potassium: 3.8 mEq/L (ref 3.5–5.1)

## 2011-07-14 NOTE — Progress Notes (Signed)
Addended by: Legrand Como on: 07/14/2011 10:39 AM   Modules accepted: Orders

## 2011-07-14 NOTE — Progress Notes (Signed)
Addended by: Legrand Como on: 07/14/2011 03:25 PM   Modules accepted: Orders

## 2011-07-14 NOTE — Progress Notes (Signed)
Addended by: Legrand Como on: 07/14/2011 10:43 AM   Modules accepted: Orders

## 2011-07-15 ENCOUNTER — Encounter: Payer: Self-pay | Admitting: Internal Medicine

## 2011-07-15 ENCOUNTER — Telehealth: Payer: Self-pay | Admitting: Internal Medicine

## 2011-07-15 NOTE — Telephone Encounter (Signed)
Left message to advise Claudia [name] to call office with synthroid dose

## 2011-07-15 NOTE — Telephone Encounter (Signed)
Please call pt's daughter: Needs more synthroid, please confirm dose and compliance and let me know . Other labs great

## 2011-07-16 ENCOUNTER — Telehealth: Payer: Self-pay | Admitting: Internal Medicine

## 2011-07-16 DIAGNOSIS — I739 Peripheral vascular disease, unspecified: Secondary | ICD-10-CM

## 2011-07-16 NOTE — Telephone Encounter (Signed)
Tamesha, I entered the new order, could you delete the old one and be sure the new one goes to right person? [facility]

## 2011-07-16 NOTE — Telephone Encounter (Signed)
Left message for Claudia [name] to ensure that she received the change of code

## 2011-07-19 ENCOUNTER — Encounter (INDEPENDENT_AMBULATORY_CARE_PROVIDER_SITE_OTHER): Payer: Medicare Other | Admitting: Cardiology

## 2011-07-19 DIAGNOSIS — I739 Peripheral vascular disease, unspecified: Secondary | ICD-10-CM

## 2011-07-19 DIAGNOSIS — I70219 Atherosclerosis of native arteries of extremities with intermittent claudication, unspecified extremity: Secondary | ICD-10-CM

## 2011-07-19 NOTE — Telephone Encounter (Signed)
Per phone call with patient's daughter, Claudia [name], the patient's thyroid has been low, and Maralyn Sago believes this may be due to the [facility]isted [facility] not giving the patient her thyroid medications.  Would like our office to verify if this is the case please.

## 2011-07-20 NOTE — Telephone Encounter (Signed)
Please f/u on this.

## 2011-07-21 NOTE — Telephone Encounter (Signed)
Left message to call office on daughter cell.

## 2011-07-21 NOTE — Telephone Encounter (Signed)
Spoke with Pt daughter who indicated that Pt is still suppose to be on 44 MCG but she is currently checking with facility to see if they have been given it to Pt on a daily basis and is awaiting a return call from them.

## 2011-07-22 ENCOUNTER — Telehealth: Payer: Self-pay | Admitting: Internal Medicine

## 2011-07-22 DIAGNOSIS — I739 Peripheral vascular disease, unspecified: Secondary | ICD-10-CM

## 2011-07-22 NOTE — Telephone Encounter (Signed)
Advise patient's daughter, Maralyn Sago: Circulation is decreased , recommend referral to Dr Excell Seltzer (referral  entered)

## 2011-07-23 MED ORDER — LEVOTHYROXINE SODIUM 100 MCG PO TABS: 100.0000 ug | ORAL_TABLET | Freq: Every day | ORAL | Status: DC

## 2011-07-23 NOTE — Telephone Encounter (Signed)
It is unusual to have such a high TSH with good compliance with center. Plan: Increase to 100 mcg one tablet daily----> TSH in one month

## 2011-07-23 NOTE — Telephone Encounter (Signed)
Left message to call office on Pt daughter VM.

## 2011-07-23 NOTE — Telephone Encounter (Signed)
Pt daughter call back she spoke with facility and Pt has been taking the 75 mcg daily  with no missed dose.

## 2011-07-23 NOTE — Telephone Encounter (Signed)
Discuss with patient daughter Rx faxed to [phone] [facility].

## 2011-08-04 ENCOUNTER — Ambulatory Visit (INDEPENDENT_AMBULATORY_CARE_PROVIDER_SITE_OTHER): Payer: Medicare Other | Admitting: Cardiovascular Disease

## 2011-08-04 ENCOUNTER — Encounter: Payer: Self-pay | Admitting: Cardiovascular Disease

## 2011-08-04 VITALS — BP 136/72 | HR 70 | Ht 65.0 in | Wt 128.8 lb

## 2011-08-04 DIAGNOSIS — I739 Peripheral vascular disease, unspecified: Secondary | ICD-10-CM

## 2011-08-04 MED ORDER — CIPROFLOXACIN HCL 250 MG PO TABS: 250.0000 mg | ORAL_TABLET | Freq: Two times a day (BID) | ORAL | Status: AC

## 2011-08-04 NOTE — Patient Instructions (Signed)
Your physician has recommended you make the following change in your medication: START Cipro 250mg  take one by mouth twice a day for 7 days  Your physician recommends that you schedule a follow-up appointment in: 3-4 WEEKS if your leg has not improved, if your leg is better then you can follow-up as needed

## 2011-08-04 NOTE — Progress Notes (Signed)
HPI:  77 year old woman referred for evaluation of lower extremity peripheral arterial disease. The patient has long-standing gait instability and leg weakness. She lives in an assisted-living facility and ambulates with a walker. She's had frequent falls over the years. After recent fall she injured her left leg and had significant swelling and pain in this leg. Her healing was very slow and she was referred for a lower extremity duplex scan because of abnormal peripheral pulses. This showed an ABI of 77% on the right at 59% on the left. There is diffuse SFA disease bilaterally, there is some suggestion of inflow disease especially on the right.  There is popliteal disease bilaterally.  There was an incidental finding of a 2.9 x 1 cm mass in the right groin.  The patient denies leg pain with ambulation. She is not very active. She is a poor historian because of underlying dementia. She denies rest pain or past history of ulceration. She denies chest pain or dyspnea.  Outpatient Encounter Prescriptions as of 08/04/2011  Medication Sig Dispense Refill  . acetaminophen (TYLENOL) 325 MG tablet Take 1 tablet (325 mg total) by mouth at bedtime.  30 tablet  6  . aspirin 81 MG tablet Take 81 mg by mouth daily.        . Azelastine HCl (ASTEPRO) 0.15 % SOLN Place 1 spray into the nose as needed.       . calcium carbonate, dosed in mg elemental calcium, 1250 MG/5ML Take 500 mg of elemental calcium by mouth daily.        . cetirizine (ZYRTEC) 10 MG tablet Take 10 mg by mouth daily.        Marland Kitchen donepezil (ARICEPT) 10 MG tablet Take 10 mg by mouth at bedtime.        Marland Kitchen doxercalciferol (HECTOROL) 0.5 MCG capsule 1 po M,W,F.       . lactobacillus rhamnosus, GG, (CULTURELLE) 10 B CELL capsule Take 1 capsule by mouth daily.        Marland Kitchen levothyroxine (SYNTHROID, LEVOTHROID) 100 MCG tablet Take 1 tablet (100 mcg total) by mouth daily.  30 tablet  0  . Loperamide-Simethicone (IMODIUM ADVANCED) 2-125 MG TABS Take by mouth as  needed. No more than 3/day       . memantine (NAMENDA) 5 MG tablet Take 5 mg by mouth 2 (two) times daily.        . Multiple Vitamins-Minerals (CENTRUM ULTRA WOMENS PO) Take by mouth.        . sertraline (ZOLOFT) 50 MG tablet Take 100 mg by mouth daily.        Marland Kitchen triamterene-hydrochlorothiazide (DYAZIDE) 37.5-25 MG per capsule Take 1 capsule by mouth daily.        Marland Kitchen DISCONTD: AMBULATORY NON FORMULARY MEDICATION Culturelle Probiotic Supplement with natural dairy free lactobacillus capsule. Take 1 tab once daily for 90 days, w/ 3 refills.  90 capsule  3  . DISCONTD: mupirocin (BACTROBAN) 2 % cream Apply to affected area 2  times daily x 10 days  30 g  0    JXB:JYNWGNFAOZH+YQMVHQION+GEXBMWUXLK acid+aspartame and Sulfonamide derivatives  Past Medical History  Diagnosis Date  . COPD (chronic obstructive pulmonary disease)   . Hyperlipidemia   . Hypertension   . Dementia   . Renal insufficiency   . Breast cancer   . Hypothyroidism   . Osteopenia     last dexa aprox 2008- per daughter  . Osteoarthritis   . Depression     Past Surgical History  Procedure  Date  . Appendectomy   . Abdominal hysterectomy   . Tonsillectomy     History   Social History  . Marital Status: Widowed    Spouse Name: N/A    Number of Children: N/A  . Years of Education: N/A   Occupational History  . Not on file.   Social History Main Topics  . Smoking status: Former Games developer  . Smokeless tobacco: Not on file  . Alcohol Use: Yes     socially  . Drug Use: Not on file  . Sexually Active: Not on file   Other Topics Concern  . Not on file   Social History Narrative   Moved back to [place] 5/2010Lives at [facility] (ALF)    Family history: No premature coronary disease  ROS: General: no fevers/chills/night sweats Eyes: no blurry vision, diplopia, or amaurosis ENT: no sore throat or hearing loss Resp: no cough, wheezing, or hemoptysis CV: no edema or palpitations GI: no abdominal pain, nausea,  vomiting, diarrhea, or constipation GU: no dysuria, frequency, or hematuria Skin: no rash Neuro: no headache, numbness, tingling, positive for weakness of extremities Musculoskeletal: no joint pain or swelling Heme: no bleeding, DVT, or easy bruising Endo: no polydipsia or polyuria  BP 136/72  Pulse 70  Ht 5\' 5"  (1.651 m)  Wt 58.423 kg (128 lb 12.8 oz)  BMI 21.43 kg/m2  PHYSICAL EXAM: Pt is alert, elderly woman in no acute distress HEENT: normal Neck: JVP normal. Carotid upstrokes normal without bruits. No thyromegaly. Lungs: equal expansion, clear bilaterally CV: Apex is discrete and nondisplaced, RRR without murmur or gallop Abd: soft, NT, +BS, no bruit, no hepatosplenomegaly Back: no CVA tenderness Ext: femoral pulses are diminished but palpable bilaterally, pedal pulses are nonpalpable bilaterally. The left lower leg he has erythema and 1+ edema throughout the pretibial region. Skin: warm and dry  Neuro: CNII-XII intact             Strength intact = bilaterally  EKG:  Normal sinus rhythm 70 beats per minute, inferior infarct age undetermined, cannot rule out anteroseptal infarct age undetermined  ASSESSMENT AND PLAN:

## 2011-08-04 NOTE — Telephone Encounter (Signed)
See previous phone note 07/15/11--this has been discussed and taken care of    KP

## 2011-08-04 NOTE — Assessment & Plan Note (Signed)
The patient has moderate lower extremity arterial insufficiency with dampened waveforms in the left ankle. Her pressures are adequate for healing. Her exam is suggestive of a mild cellulitis. I recommended a course of Cipro 250 mg twice daily (reduced dose because of renal impairment).  I will see her back in 3-4 weeks to see if she is improving clinically. I don't think she has critical limb ischemia and I expect that her arterial supply is good enough for healing. I would not recommend invasive angiography in this patient with advanced age and chronic kidney disease with limited mobility.  The patient had an incidental finding of a right groin mass. Consider noncontrasted pelvic CT, but I will defer to Dr. Drue Novel.

## 2011-08-04 NOTE — Telephone Encounter (Addendum)
Discuss with patient Claudia [name]

## 2011-08-12 ENCOUNTER — Telehealth: Payer: Self-pay | Admitting: Internal Medicine

## 2011-08-12 NOTE — Telephone Encounter (Signed)
Spoke with Pt daughter who states that Pt is not having any pain in area now but will monitor her and let us know if symptoms change. FL2 faxed and original placed up front for pick up.

## 2011-08-12 NOTE — Telephone Encounter (Signed)
1. FL2 done 2. -Notes from cardiology reviewed, has a "groin mass", please discuss with the patient's daughter --Ms. Sarah--, I would like to check on her and see if we need to do anything about the groin mass. No urgent unless area is hurting

## 2011-08-12 NOTE — Telephone Encounter (Signed)
Left message to call office

## 2011-08-16 ENCOUNTER — Encounter: Payer: Medicare Other | Admitting: Cardiology

## 2011-08-16 ENCOUNTER — Encounter: Payer: Medicare Other | Admitting: *Deleted

## 2011-08-19 ENCOUNTER — Telehealth: Payer: Self-pay

## 2011-08-19 NOTE — Telephone Encounter (Signed)
Call from Maralyn Sago and she stated that she got a call from [facility] and they stated the patient was on Cipro for 10 days for Cellulitis but it has not resolved. She completed the Cipro a few days ago and says [employer] described the rash as being red and look mean. Maralyn Sago is out of town and can not bring the patient in and wanted to know if we could send in an antibiotic. Please advise    KP

## 2011-08-20 MED ORDER — DOXYCYCLINE HYCLATE 100 MG PO TABS: 100.0000 mg | ORAL_TABLET | Freq: Two times a day (BID) | ORAL | Status: AC

## 2011-08-20 NOTE — Telephone Encounter (Signed)
Discussed with Claudia [name] and they is no one to bring in the patient and she is out of town. She stated she can bring he patient in on Monday morning if we could fax the Rx in to [facility]. Apt scheduled with Dr.Paz on Monday and Rx faxed to [facility].     KP

## 2011-08-20 NOTE — Telephone Encounter (Signed)
I don't believe I have seen her for that problem. She really needs to be seen, a good option would to see a  physician that goes to that facility , an ER visit is also appropriate . If they are unable to do either or  if the patient refuses, okay to prescribe doxycycline 100 mg 1 by mouth twice a day for one week understanding that she must go to the ER if the  infection spreads, she has fever or is not improving

## 2011-08-23 ENCOUNTER — Ambulatory Visit (INDEPENDENT_AMBULATORY_CARE_PROVIDER_SITE_OTHER): Payer: Self-pay | Admitting: Internal Medicine

## 2011-08-23 ENCOUNTER — Encounter: Payer: Self-pay | Admitting: Internal Medicine

## 2011-08-23 VITALS — BP 110/82 | HR 90 | Temp 98.1°F | Ht 62.5 in | Wt 131.0 lb

## 2011-08-23 DIAGNOSIS — L0390 Cellulitis, unspecified: Secondary | ICD-10-CM

## 2011-08-23 DIAGNOSIS — S8010XA Contusion of unspecified lower leg, initial encounter: Secondary | ICD-10-CM

## 2011-08-23 DIAGNOSIS — S8012XA Contusion of left lower leg, initial encounter: Secondary | ICD-10-CM

## 2011-08-23 NOTE — Assessment & Plan Note (Addendum)
Significant left leg swelling since a contusion 2 months ago, not getting any better. Initial evaluation in November included an x-ray and a ultrasound ruling out DVT. She also had ABIs which showed peripheral vascular disease, status post cardiology evaluation, they prescribed conservative treatment. We discussed what would be the next step, the swelling is significant enough that I am concerned and I will consult orthopedic surgery.   We talked about doing another ultrasound to rule out DVT again, patient's daughter prefer to wait until they talk to orthopedic surgery .

## 2011-08-23 NOTE — Assessment & Plan Note (Signed)
Mild redness and very mild erythema in the leg, she is on doxycycline. Will continue with abx until gone.

## 2011-08-23 NOTE — Progress Notes (Signed)
  Subjective:    Patient ID: Claudia [name], female    DOB: 1923-05-01, 78 y.o.   MRN: [id number]  HPI Acute visit for the evaluation of left leg swelling. Since her last office visit, swelling is not any better, she also had ABIs show peripheral vascular disease, she saw cardiology, disease was not critical and they prescribed conservative treatment. They also note some redness in the leg and prescribed Cipro, I got a phone call last week stating that the leg is not any better and described doxycycline which she is still taking.  Past Medical History: COPD Hyperlipidemia Hypertension Dementia  Renal insufficiency  (creat 2008 was 1.8) Peripheral vascular disease, saw cardiology 08-2011: Observation Hyperthyroidism Osteopenia, last dexa aprox 2008 (per daugher) Osteoarthritis Depression Breast cancer, hx of  Past Surgical History: Reviewed history from 12/30/2008 and no changes required. Appendectomy Hysterectomy Tonsillectomy  Review of Systems Leg swelling is not met are worse compared to 2 months ago. The patient and the family has noted some areas in the left leg that are oozing    Objective:   Physical Exam Alert, no apparent distress. Left leg: Quite an amount of swelling noted midway down from the knee. The swelling is pitting  and involve both the pretibial area and calf. skin is stretched w/ mild redness and 2 superficial excoriations. Skin is also quite dry.     Assessment & Plan:

## 2011-08-23 NOTE — Patient Instructions (Signed)
take doxycycline until gone Elevate the leg aveeno

## 2011-09-03 ENCOUNTER — Ambulatory Visit: Payer: Medicare Other | Admitting: Cardiovascular Disease

## 2011-09-08 ENCOUNTER — Telehealth: Payer: Self-pay | Admitting: Internal Medicine

## 2011-09-08 NOTE — Telephone Encounter (Signed)
Left message on voicemail for Sarah(Daughter) to return call when available, reason for call: ask name of assisted living facility so that we can fax order over for TSH

## 2011-09-08 NOTE — Telephone Encounter (Signed)
Got results from her ALF: ca and vit D normal.  Daughter request a Rx for a wheelchair: done (handwritten) Claudia [name] Please call her ALF:  needs a TSH--> dx hypothyroidism , fax results to me

## 2011-09-09 NOTE — Telephone Encounter (Signed)
Spoke with pt's daughter & she would like to know if labs for Vitamin D3 & Calcium Dx 733.90 can be added on to [facility] lab order. Please advise.

## 2011-09-09 NOTE — Telephone Encounter (Signed)
The just did the calcium and vitamin D and they were normal.

## 2011-09-10 ENCOUNTER — Encounter: Payer: Self-pay | Admitting: Internal Medicine

## 2011-09-13 NOTE — Telephone Encounter (Signed)
Phoned pt's daughter to inform her of labs.

## 2011-09-20 ENCOUNTER — Telehealth: Payer: Self-pay | Admitting: Cardiovascular Disease

## 2011-09-20 NOTE — Telephone Encounter (Signed)
New Msg: pt daughter calling wanting to know if pt needs to keep appt and see MD on Thursday, 09/23/11, considering pt legs are no longer swollen. Please return pt daughter call to discuss further.

## 2011-09-20 NOTE — Telephone Encounter (Signed)
Appt cancelled per pt request. 

## 2011-09-23 ENCOUNTER — Ambulatory Visit: Payer: Self-pay | Admitting: Cardiovascular Disease

## 2011-10-10 ENCOUNTER — Telehealth: Payer: Self-pay | Admitting: Internal Medicine

## 2011-10-10 NOTE — Telephone Encounter (Signed)
pleas call pt's ALF , she is due for a TSH, dx hypothyroidism Please arrange

## 2011-10-12 MED ORDER — AMBULATORY NON FORMULARY MEDICATION: Status: DC

## 2011-10-12 NOTE — Telephone Encounter (Signed)
Faxed order over to [facility] for TSH.

## 2011-10-13 ENCOUNTER — Encounter (HOSPITAL_BASED_OUTPATIENT_CLINIC_OR_DEPARTMENT_OTHER): Payer: Medicare Other | Attending: Plastic Surgery

## 2011-10-13 DIAGNOSIS — L97409 Non-pressure chronic ulcer of unspecified heel and midfoot with unspecified severity: Secondary | ICD-10-CM | POA: Insufficient documentation

## 2011-10-13 DIAGNOSIS — Z7982 Long term (current) use of aspirin: Secondary | ICD-10-CM | POA: Insufficient documentation

## 2011-10-13 DIAGNOSIS — L97509 Non-pressure chronic ulcer of other part of unspecified foot with unspecified severity: Secondary | ICD-10-CM | POA: Insufficient documentation

## 2011-10-13 DIAGNOSIS — M1990 Unspecified osteoarthritis, unspecified site: Secondary | ICD-10-CM | POA: Insufficient documentation

## 2011-10-13 DIAGNOSIS — M6281 Muscle weakness (generalized): Secondary | ICD-10-CM

## 2011-10-13 DIAGNOSIS — L02619 Cutaneous abscess of unspecified foot: Secondary | ICD-10-CM | POA: Insufficient documentation

## 2011-10-13 DIAGNOSIS — L8992 Pressure ulcer of unspecified site, stage 2: Secondary | ICD-10-CM

## 2011-10-13 DIAGNOSIS — J438 Other emphysema: Secondary | ICD-10-CM | POA: Insufficient documentation

## 2011-10-13 DIAGNOSIS — R609 Edema, unspecified: Secondary | ICD-10-CM | POA: Insufficient documentation

## 2011-10-13 DIAGNOSIS — L89609 Pressure ulcer of unspecified heel, unspecified stage: Secondary | ICD-10-CM

## 2011-10-13 DIAGNOSIS — I8390 Asymptomatic varicose veins of unspecified lower extremity: Secondary | ICD-10-CM | POA: Insufficient documentation

## 2011-10-13 DIAGNOSIS — Z8673 Personal history of transient ischemic attack (TIA), and cerebral infarction without residual deficits: Secondary | ICD-10-CM | POA: Insufficient documentation

## 2011-10-13 DIAGNOSIS — I739 Peripheral vascular disease, unspecified: Secondary | ICD-10-CM | POA: Insufficient documentation

## 2011-10-13 DIAGNOSIS — I10 Essential (primary) hypertension: Secondary | ICD-10-CM | POA: Insufficient documentation

## 2011-10-13 DIAGNOSIS — Z79899 Other long term (current) drug therapy: Secondary | ICD-10-CM | POA: Insufficient documentation

## 2011-10-13 DIAGNOSIS — F0390 Unspecified dementia without behavioral disturbance: Secondary | ICD-10-CM

## 2011-10-14 NOTE — Progress Notes (Signed)
[facility]  NAMEESMEE, FALLAW                ACCOUNT NO.:  [id number]  MEDICAL RECORD NO.:  [id number]      DATE OF BIRTH:  17-Sep-1922  PHYSICIAN:  Wayland Denis, DO       VISIT DATE:  10/13/2011                                  OFFICE VISIT   CHIEF COMPLAINT:  Right lower extremity ulcer.  HISTORY OF PRESENT ILLNESS:  The patient is an 78 year old female here with family for evaluation of a right lower extremity heel and great toe ulcer.  She has been under the care of Dr. Renae Fickle for peripheral vascular disease and Dr. Althea Charon.  She has had 2 antibiotics with the most recent being doxycycline to help with the cellulitis and redness, which seems to have helped quite a bit.  Her pain is still there, but much better in the last week.  She has been dealing with lower extremity swelling for the past 6 months.  PAST MEDICAL HISTORY:  Positive for peripheral vascular disease, hypertension, stroke, hyperthyroid, depression, osteoarthritis, emphysema, dementia, renal insufficiency, colitis and cellulitis.  PAST SURGICAL HISTORY:  Bilateral cataract, lumpectomy, hysterectomy, appendectomy and tonsillectomy.  MEDICATIONS:  Include aspirin, levothyroxine, vitamin D, Centrum, Zoloft, Maxzide, donepezil, tramadol, __________, Vicodin, Zyrtec, Nicotrol, doxycycline.  ALLERGIES:  Include AUGMENTIN, SULFA, and she is LACTOSE INTOLERANT.  SOCIAL HISTORY:  She has family involved in her care, but currently lives at a retirement facility.  She does have home-health helping with the dressing changes, which are unclear of what she is having placed on the dressings at present.  REVIEW OF SYSTEMS:  As noted above, otherwise negative.  PHYSICAL EXAMINATION:  The patient is alert.  She seems to be oriented to place and her condition.  Pupils are equal.  Extraocular muscles are intact.  Her glasses are in place.  No cervical lymphadenopathy.  Her upper extremity pulses are  strong and regular.  Her abdomen is slightly tender to palpation, but no rebound or guarding.  Lower extremity, severe peripheral vascular disease noted with varicose veins, weak pulses, swelling, 1+ pitting edema, and slightly cold, but does have a capillary refill.  The wound on the tip of the toe does not appear to be infected or the heel.  There is a little bit of peri-wound redness, but no significant drainage.  Recommend Aquacel Ag with a foam heel cup, Kerlix and Profore, and see her back in 1 week for followup.  I will also check a prealbumin to make sure she is getting the nutrition she needs as well as add a antibiotic and decrease the pressure on her foot with elevation.  She is encouraged to follow up with her primary physician regarding her lower extremity swelling as well.     Wayland Denis, DO     CS/MEDQ  D:  10/13/2011  T:  10/13/2011  Job:  [id number]

## 2011-10-21 ENCOUNTER — Telehealth: Payer: Self-pay | Admitting: Internal Medicine

## 2011-10-21 NOTE — Progress Notes (Signed)
[facility]  Claudia [name], Claudia [name]                ACCOUNT NO.:  [id number]  MEDICAL RECORD NO.:  [id number]      DATE OF BIRTH:  04-May-1923  PHYSICIAN:  Wayland Denis, DO       VISIT DATE:  10/20/2011                                  OFFICE VISIT   Claudia [name] is here with her family for followup on her right great toe and heel ulcer.  She has been using silver alginate with a Profore Lite and heel cup with very good improvement overall.  The cellulitis has improved.  The redness is decreased.  The pain is improving slowly.  The heel looks to be nearly completely healed.  There has been no change in her social history or medications.  PHYSICAL EXAMINATION:  GENERAL:  She is alert, oriented, cooperative, not in any acute distress.  She is pleasant. EYES:  Pupils are equal.  Extraocular muscles are intact. NECK:  No cervical lymphadenopathy. RESPIRATORY:  Breathing is unlabored. HEART:  Regular. ABDOMEN:  Soft.  The wound is noted as above and described in the note.  We will continue with the silver alginate, Profore Lite, and heel cup and encouraged elevation and protein intake increases to help it heal and we will see her back in a week.     Alan Ripper Sanger, DO     CS/MEDQ  D:  10/20/2011  T:  10/21/2011  Job:  [id number]

## 2011-10-21 NOTE — Telephone Encounter (Signed)
Per Albin Felling, patient continues with NON removable dressing on Right leg from wound care. She will try again in a week. Gwenith Spitz ph# [phone]

## 2011-10-21 NOTE — Telephone Encounter (Signed)
Thank you :)

## 2011-10-21 NOTE — Telephone Encounter (Signed)
Spoke with Albin Felling & she states there are wounds on right heel & right big toe. Pt went to the wound care center last Wednesday & they applied a dressing. Pt went back this week had the dressing removed & reapplied. Pt is going back again to the wound care center next week.

## 2011-10-21 NOTE — Telephone Encounter (Signed)
Please advise 

## 2011-10-21 NOTE — Telephone Encounter (Signed)
Please clarify, I saw her 08-23-2011 w/ 2 superficial  excoriations in the leg, is that what they are calling wounds? Are those places still an issue? If that is the case somebody needs to look at it (wound care nurse?)

## 2011-10-24 ENCOUNTER — Telehealth: Payer: Self-pay | Admitting: Internal Medicine

## 2011-10-24 NOTE — Telephone Encounter (Signed)
Received a TSH from 10-19-2011: 22.4 Please call pt's daughter, if compliance has been ok, then increase synthroid from 100 mcg to 125 mcg If she suspects compliance is poor, then needs to take meds every day. Either way  needs to recheck a TSH in 6 weeks

## 2011-10-26 ENCOUNTER — Encounter: Payer: Self-pay | Admitting: Internal Medicine

## 2011-10-27 ENCOUNTER — Emergency Department (INDEPENDENT_AMBULATORY_CARE_PROVIDER_SITE_OTHER): Payer: Medicare Other

## 2011-10-27 ENCOUNTER — Inpatient Hospital Stay (HOSPITAL_BASED_OUTPATIENT_CLINIC_OR_DEPARTMENT_OTHER)
Admission: EM | Admit: 2011-10-27 | Discharge: 2011-11-01 | DRG: 481 | Disposition: A | Discharge status: Skilled Nursing Facility | Payer: Medicare Other | Source: Ambulatory Visit | Attending: Family Medicine | Admitting: Family Medicine

## 2011-10-27 ENCOUNTER — Other Ambulatory Visit: Payer: Self-pay

## 2011-10-27 ENCOUNTER — Encounter (HOSPITAL_BASED_OUTPATIENT_CLINIC_OR_DEPARTMENT_OTHER): Payer: Self-pay | Admitting: *Deleted

## 2011-10-27 DIAGNOSIS — Z8673 Personal history of transient ischemic attack (TIA), and cerebral infarction without residual deficits: Secondary | ICD-10-CM

## 2011-10-27 DIAGNOSIS — W19XXXA Unspecified fall, initial encounter: Secondary | ICD-10-CM

## 2011-10-27 DIAGNOSIS — F068 Other specified mental disorders due to known physiological condition: Secondary | ICD-10-CM | POA: Diagnosis present

## 2011-10-27 DIAGNOSIS — M810 Age-related osteoporosis without current pathological fracture: Secondary | ICD-10-CM | POA: Diagnosis present

## 2011-10-27 DIAGNOSIS — I517 Cardiomegaly: Secondary | ICD-10-CM

## 2011-10-27 DIAGNOSIS — Z043 Encounter for examination and observation following other accident: Secondary | ICD-10-CM

## 2011-10-27 DIAGNOSIS — M899 Disorder of bone, unspecified: Secondary | ICD-10-CM

## 2011-10-27 DIAGNOSIS — I10 Essential (primary) hypertension: Secondary | ICD-10-CM

## 2011-10-27 DIAGNOSIS — E039 Hypothyroidism, unspecified: Secondary | ICD-10-CM | POA: Diagnosis present

## 2011-10-27 DIAGNOSIS — S72033A Displaced midcervical fracture of unspecified femur, initial encounter for closed fracture: Principal | ICD-10-CM | POA: Diagnosis present

## 2011-10-27 DIAGNOSIS — F0390 Unspecified dementia without behavioral disturbance: Secondary | ICD-10-CM | POA: Diagnosis present

## 2011-10-27 DIAGNOSIS — Y9210 Unspecified residential institution as the place of occurrence of the external cause: Secondary | ICD-10-CM | POA: Diagnosis present

## 2011-10-27 DIAGNOSIS — B952 Enterococcus as the cause of diseases classified elsewhere: Secondary | ICD-10-CM | POA: Diagnosis present

## 2011-10-27 DIAGNOSIS — N183 Chronic kidney disease, stage 3 unspecified: Secondary | ICD-10-CM | POA: Diagnosis present

## 2011-10-27 DIAGNOSIS — Z66 Do not resuscitate: Secondary | ICD-10-CM | POA: Diagnosis present

## 2011-10-27 DIAGNOSIS — N390 Urinary tract infection, site not specified: Secondary | ICD-10-CM | POA: Diagnosis present

## 2011-10-27 DIAGNOSIS — D72829 Elevated white blood cell count, unspecified: Secondary | ICD-10-CM | POA: Diagnosis present

## 2011-10-27 DIAGNOSIS — I129 Hypertensive chronic kidney disease with stage 1 through stage 4 chronic kidney disease, or unspecified chronic kidney disease: Secondary | ICD-10-CM | POA: Diagnosis present

## 2011-10-27 DIAGNOSIS — R5381 Other malaise: Secondary | ICD-10-CM

## 2011-10-27 DIAGNOSIS — N259 Disorder resulting from impaired renal tubular function, unspecified: Secondary | ICD-10-CM | POA: Diagnosis present

## 2011-10-27 DIAGNOSIS — S72019A Unspecified intracapsular fracture of unspecified femur, initial encounter for closed fracture: Secondary | ICD-10-CM

## 2011-10-27 DIAGNOSIS — J4489 Other specified chronic obstructive pulmonary disease: Secondary | ICD-10-CM | POA: Diagnosis present

## 2011-10-27 DIAGNOSIS — J449 Chronic obstructive pulmonary disease, unspecified: Secondary | ICD-10-CM | POA: Diagnosis present

## 2011-10-27 HISTORY — DX: Cerebral infarction, unspecified: I63.9

## 2011-10-27 HISTORY — DX: Peripheral vascular disease, unspecified: I73.9

## 2011-10-27 LAB — DIFFERENTIAL
Basophils Absolute: 0 10*3/uL (ref 0.0–0.1)
Basophils Relative: 0 % (ref 0–1)
Lymphocytes Relative: 10 % — ABNORMAL LOW (ref 12–46)
Monocytes Absolute: 1.4 10*3/uL — ABNORMAL HIGH (ref 0.1–1.0)
Monocytes Relative: 10 % (ref 3–12)
Neutro Abs: 10.9 10*3/uL — ABNORMAL HIGH (ref 1.7–7.7)
Neutrophils Relative %: 79 % — ABNORMAL HIGH (ref 43–77)

## 2011-10-27 LAB — URINALYSIS, ROUTINE W REFLEX MICROSCOPIC
Bilirubin Urine: NEGATIVE
Nitrite: NEGATIVE
Specific Gravity, Urine: 1.027 (ref 1.005–1.030)
Urobilinogen, UA: 0.2 mg/dL (ref 0.0–1.0)
pH: 6 (ref 5.0–8.0)

## 2011-10-27 LAB — COMPREHENSIVE METABOLIC PANEL
Alkaline Phosphatase: 63 U/L (ref 39–117)
BUN: 31 mg/dL — ABNORMAL HIGH (ref 6–23)
Chloride: 106 mEq/L (ref 96–112)
Creatinine, Ser: 1.7 mg/dL — ABNORMAL HIGH (ref 0.50–1.10)
GFR calc Af Amer: 30 mL/min — ABNORMAL LOW (ref >90)
GFR calc non Af Amer: 26 mL/min — ABNORMAL LOW (ref >90)
Glucose, Bld: 106 mg/dL — ABNORMAL HIGH (ref 70–99)
Potassium: 3.2 mEq/L — ABNORMAL LOW (ref 3.5–5.1)
Total Bilirubin: 0.6 mg/dL (ref 0.3–1.2)

## 2011-10-27 LAB — CBC
HCT: 41.1 % (ref 36.0–46.0)
Hemoglobin: 13.9 g/dL (ref 12.0–15.0)
WBC: 13.8 10*3/uL — ABNORMAL HIGH (ref 4.0–10.5)

## 2011-10-27 LAB — URINE MICROSCOPIC-ADD ON

## 2011-10-27 MED ORDER — SODIUM CHLORIDE 0.9 % IV BOLUS (SEPSIS)
1000.0000 mL | Freq: Once | INTRAVENOUS | Status: AC
  Administered 2011-10-27: 1000 mL via INTRAVENOUS

## 2011-10-27 MED ORDER — SODIUM CHLORIDE 0.9 % IV SOLN
INTRAVENOUS | Status: AC
  Administered 2011-10-27: 19:00:00 via INTRAVENOUS

## 2011-10-27 NOTE — ED Provider Notes (Addendum)
History     CSN: [id number]  Arrival date & time 10/27/11  1453   First MD Initiated Contact with Patient 10/27/11 1459      Chief Complaint  Patient presents with  . Shaking    (Consider location/radiation/quality/duration/timing/severity/associated sxs/prior treatment) HPI Comments: EMS states that family wants pt evaluated for shaking episodes where she does not loose consciousness.  States she has had them for 1 week.  Pt denies any symptoms on exam and no family is present.  The history is provided by the EMS personnel and the patient. The history is limited by the absence of a caregiver (pt is demented and states she does not know why she is here and has no complaints.).    Past Medical History  Diagnosis Date  . COPD (chronic obstructive pulmonary disease)   . Hyperlipidemia   . Hypertension   . Dementia   . Renal insufficiency   . Breast cancer   . Hypothyroidism   . Osteopenia     last dexa aprox 2008- per daughter  . Osteoarthritis   . Depression     Past Surgical History  Procedure Date  . Appendectomy   . Abdominal hysterectomy   . Tonsillectomy     No family history on file.  History  Substance Use Topics  . Smoking status: Former Games developer  . Smokeless tobacco: Not on file  . Alcohol Use: Yes     socially    OB History    Grav Para Term Preterm Abortions TAB SAB Ect Mult Living                  Review of Systems  Unable to perform ROS   Allergies  WUJ:WJXBJYNWGNF+AOZHYQMVH+QIONGEXBMW acid+aspartame and Sulfonamide derivatives  Home Medications   Current Outpatient Rx  Name Route Sig Dispense Refill  . ACETAMINOPHEN 325 MG PO TABS Oral Take 1 tablet (325 mg total) by mouth at bedtime. 30 tablet 6  . AMBULATORY NON FORMULARY MEDICATION  aveeno OTC twice a day, keep LEFT  leg elevated at least 1 hour twice a day    . ASPIRIN 81 MG PO TABS Oral Take 81 mg by mouth every other day.     . AZELASTINE HCL 0.15 % NA SOLN Nasal Place 1 spray  into the nose as needed.     Marland Kitchen CALCIUM CARBONATE 1250 MG/5ML PO SUSP Oral Take 500 mg of elemental calcium by mouth daily.      Marland Kitchen CETIRIZINE HCL 10 MG PO TABS Oral Take 10 mg by mouth daily.      . DONEPEZIL HCL 10 MG PO TABS Oral Take 10 mg by mouth at bedtime.      Marland Kitchen DOXERCALCIFEROL 0.5 MCG PO CAPS  1 po M,W,F.     . LACTOBACILLUS RHAMNOSUS (GG) 10 B CELL PO CAPS Oral Take 1 capsule by mouth daily.      Marland Kitchen LEVOTHYROXINE SODIUM 100 MCG PO TABS Oral Take 1 tablet (100 mcg total) by mouth daily. 30 tablet 0  . LOPERAMIDE-SIMETHICONE 2-125 MG PO TABS Oral Take by mouth as needed. No more than 3/day     . MEMANTINE HCL 5 MG PO TABS Oral Take 5 mg by mouth 2 (two) times daily.      . CENTRUM ULTRA WOMENS PO Oral Take by mouth.      . SERTRALINE HCL 50 MG PO TABS Oral Take 100 mg by mouth daily.      . TRIAMTERENE-HCTZ 37.5-25 MG PO CAPS Oral  Take 1 capsule by mouth daily.        BP 184/89  Pulse 100  Temp(Src) 98.3 F (36.8 C) (Oral)  Resp 16  SpO2 95%  Physical Exam  Nursing note and vitals reviewed. Constitutional: She is oriented to person, place, and time. She appears well-developed and well-nourished. No distress.  HENT:  Head: Normocephalic and atraumatic.  Mouth/Throat: Mucous membranes are dry.  Eyes: EOM are normal. Pupils are equal, round, and reactive to light.  Neck: Normal range of motion. Neck supple.  Cardiovascular: Normal heart sounds and intact distal pulses.  An irregular rhythm present.  Occasional extrasystoles are present. Tachycardia present.  Exam reveals no friction rub.   No murmur heard. Pulmonary/Chest: Effort normal and breath sounds normal. She has no wheezes. She has no rales.  Abdominal: Soft. Normal appearance and bowel sounds are normal. She exhibits no distension. There is tenderness in the suprapubic area. There is no rebound, no guarding and no CVA tenderness.  Musculoskeletal: Normal range of motion. She exhibits no tenderness.       No edema.   Contractures in lower ext with coband on both foot without signs of swelling and good distal circulation  Neurological: She is alert and oriented to person, place, and time. No cranial nerve deficit.  Skin: Skin is warm and dry. No rash noted.  Psychiatric: She has a normal mood and affect. Her behavior is normal.    ED Course  Procedures (including critical care time)  Labs Reviewed  CBC - Abnormal; Notable for the following:    WBC 13.8 (*)    RDW 15.6 (*)    Platelets 145 (*)    All other components within normal limits  DIFFERENTIAL - Abnormal; Notable for the following:    Neutrophils Relative 79 (*)    Neutro Abs 10.9 (*)    Lymphocytes Relative 10 (*)    Monocytes Absolute 1.4 (*)    All other components within normal limits  URINALYSIS, ROUTINE W REFLEX MICROSCOPIC - Abnormal; Notable for the following:    Hgb urine dipstick TRACE (*)    Ketones, ur 15 (*)    Protein, ur 100 (*)    All other components within normal limits  COMPREHENSIVE METABOLIC PANEL - Abnormal; Notable for the following:    Potassium 3.2 (*)    Glucose, Bld 106 (*)    BUN 31 (*)    Creatinine, Ser 1.70 (*)    Albumin 3.0 (*)    AST 54 (*)    GFR calc non Af Amer 26 (*)    GFR calc Af Amer 30 (*)    All other components within normal limits  URINE MICROSCOPIC-ADD ON - Abnormal; Notable for the following:    Bacteria, UA MANY (*)    All other components within normal limits  URINE CULTURE   Dg Chest 1 View  10/27/2011  *RADIOLOGY REPORT*  Clinical Data: Larey Seat on Monday with left hip pain  CHEST - 1 VIEW  Comparison: Chest x-ray of 07/17/2009  Findings: The lungs are clear.  The heart is mildly enlarged.  The bones are osteopenic.  Surgical clips overlie the left axilla.  IMPRESSION: No active lung disease.  Mild cardiomegaly.  Osteopenia.  Original Report Authenticated By: Juline Patch, M.D.   Dg Hip Complete Left  10/27/2011  *RADIOLOGY REPORT*  Clinical Data: Status post fall.  Pain.  LEFT HIP  - COMPLETE 2+ VIEW  Comparison: Plain film the pelvis 08/11/2009.  Findings: The patient  has an acute subcapital fracture of the left hip.  No other fracture is identified.  Femoral heads are located bilaterally.  IMPRESSION: Acute subcapital fracture left hip.  Per CMS PQRS reporting requirements (PQRS Measure 24): Given the patient's age of greater than 50 and the fracture site (hip, distal radius, or spine), the patient should be tested for osteoporosis using DXA, and the appropriate treatment considered based on the DXA results.  Original Report Authenticated By: Bernadene Bell. Maricela Curet, M.D.   Ct Head Wo Contrast  10/27/2011  *RADIOLOGY REPORT*  Clinical Data: .  Shaking.  Recent fall.  History of strokes. History of hypertension, breast cancer and dementia.  CT HEAD WITHOUT CONTRAST  Technique:  Contiguous axial images were obtained from the base of the skull through the vertex without contrast.  Comparison: 08/14/2010 and previous  Findings: The brain shows generalized atrophy.  There are extensive old small vessel infarctions throughout the hemispheric white matter.  No sign of acute infarction, mass lesion, hemorrhage, hydrocephalus or extra-axial collection.  No calvarial lesion. Small calvarial lucencies have not changed.  No skull fracture.  No fluid in the sinuses, middle ears or mastoids.  IMPRESSION: No acute or traumatic finding.  Atrophy and extensive chronic small vessel disease throughout the brain.  Original Report Authenticated By: Thomasenia Sales, M.D.    Date: 10/27/2011  Rate: 94  Rhythm: normal sinus rhythm  QRS Axis: left  Intervals: normal  ST/T Wave abnormalities: nonspecific ST/T changes  Conduction Disutrbances:none  Narrative Interpretation:   Old EKG Reviewed: unchanged    No diagnosis found.    MDM   Pt is unable to tell me why she is here today but EMS reported that family states she has been having shaking episodes over the last week without LOC.  They want her  evaluated for fever however with the brief hx that she has no LOC unlikely to be sz.  Pt denies any sx but mild lower abd pain on exam and pt states she is thirsty and appears dehydrated on exam.  Pt is mildly tachy at 100 and irregular but clear lung sounds and no lower ext swelling. Concern for possible UTI vs other infection vs electrolyte abnormalities whether fever is causing shakes vs lyte abnormalities.  Pt is on zoloft but no displaying serotonin syndrome.  Per med record no new meds.  5:25 PM Patient's family arrived they stated that she fell on Monday and has been yelling in pain ever since. She is not getting out of bed and states that her hips hurt. The pelvis was noted this patient was unable to say which hip hurts and it was found that she had a subcapital femoral fracture on the left. UA with bacteria but no leukocytes or nitrites. CBC with leukocytosis of 13,000 but normal chest x-ray and BMP unchanged with a creatinine of 1.7. Discussed with patient's family the possibility of surgery and the risk. They are unsure if they want her to have the surgery or not. Will admit her to the hospital for further care, pain control and consult with orthopedics.      Gwyneth Sprout, MD 10/27/11 1727  Gwyneth Sprout, MD 10/27/11 1728

## 2011-10-27 NOTE — ED Notes (Signed)
12 lead ekg normal, cbg 122, saline lock left forearm.  Placed on 2l o2 for sat of 92%.

## 2011-10-27 NOTE — ED Notes (Signed)
MD at bedside. 

## 2011-10-27 NOTE — ED Notes (Signed)
Report called to [facility]. Family informed of ETA.

## 2011-10-27 NOTE — ED Notes (Signed)
Awaiting admission bed.  Pt and family informed.

## 2011-10-27 NOTE — ED Notes (Signed)
Secondary Assessment-  Per family pt has had "shaking" episodes the past week.  She reports she sustained a fall Monday, however did not reports any injuries initially.  Pt has generalized pain with movement in bed. Bruising noted to left upper arm. Bilateral LE dressings noted for "recent cellulitis".  Bilateral LE elevated on pillow.

## 2011-10-27 NOTE — ED Notes (Signed)
EMS reports patient has a week history of generalized muscle shaking with no loss of consciousness.  States patient fell 3 days ago with no apparent injuries.  States she had another shaking episode today and family wants patient evaluated for seizures.

## 2011-10-27 NOTE — ED Notes (Signed)
Daughter reports pt fell on Monday.  Initially pt did not c/o pain in hips, however pain increases with movement and family reports staff at [facility] has not been able to position pt upright or get her OOB.

## 2011-10-27 NOTE — ED Notes (Signed)
[facility] arrived for transport 

## 2011-10-28 ENCOUNTER — Encounter (HOSPITAL_COMMUNITY): Payer: Self-pay | Admitting: Internal Medicine

## 2011-10-28 ENCOUNTER — Telehealth: Payer: Self-pay | Admitting: Internal Medicine

## 2011-10-28 ENCOUNTER — Inpatient Hospital Stay (HOSPITAL_COMMUNITY): Payer: Medicare Other

## 2011-10-28 ENCOUNTER — Encounter (HOSPITAL_COMMUNITY)
Admission: EM | Disposition: A | Discharge status: Skilled Nursing Facility | Payer: Self-pay | Source: Ambulatory Visit | Attending: Family Medicine

## 2011-10-28 ENCOUNTER — Inpatient Hospital Stay (HOSPITAL_COMMUNITY): Payer: Medicare Other | Admitting: Certified Registered"

## 2011-10-28 ENCOUNTER — Encounter (HOSPITAL_COMMUNITY): Payer: Self-pay | Admitting: Certified Registered"

## 2011-10-28 DIAGNOSIS — S72019A Unspecified intracapsular fracture of unspecified femur, initial encounter for closed fracture: Secondary | ICD-10-CM | POA: Diagnosis present

## 2011-10-28 HISTORY — PX: HIP PINNING,CANNULATED: SHX1758

## 2011-10-28 LAB — COMPREHENSIVE METABOLIC PANEL
ALT: 20 U/L (ref 0–35)
Alkaline Phosphatase: 60 U/L (ref 39–117)
BUN: 25 mg/dL — ABNORMAL HIGH (ref 6–23)
CO2: 25 mEq/L (ref 19–32)
Chloride: 111 mEq/L (ref 96–112)
GFR calc Af Amer: 41 mL/min — ABNORMAL LOW (ref >90)
GFR calc non Af Amer: 35 mL/min — ABNORMAL LOW (ref >90)
Glucose, Bld: 84 mg/dL (ref 70–99)
Potassium: 3.1 mEq/L — ABNORMAL LOW (ref 3.5–5.1)
Sodium: 146 mEq/L — ABNORMAL HIGH (ref 135–145)
Total Bilirubin: 0.6 mg/dL (ref 0.3–1.2)
Total Protein: 5.8 g/dL — ABNORMAL LOW (ref 6.0–8.3)

## 2011-10-28 LAB — TYPE AND SCREEN
ABO/RH(D): A POS
Antibody Screen: NEGATIVE

## 2011-10-28 LAB — CBC
HCT: 41.3 % (ref 36.0–46.0)
MCHC: 33.2 g/dL (ref 30.0–36.0)
Platelets: 138 10*3/uL — ABNORMAL LOW (ref 150–400)
RDW: 16 % — ABNORMAL HIGH (ref 11.5–15.5)
WBC: 12.3 10*3/uL — ABNORMAL HIGH (ref 4.0–10.5)

## 2011-10-28 LAB — ABO/RH: ABO/RH(D): A POS

## 2011-10-28 LAB — PROTIME-INR: Prothrombin Time: 14.8 seconds (ref 11.6–15.2)

## 2011-10-28 LAB — TSH: TSH: 8.578 u[IU]/mL — ABNORMAL HIGH (ref 0.350–4.500)

## 2011-10-28 SURGERY — FIXATION, FEMUR, NECK, PERCUTANEOUS, USING SCREW
Anesthesia: General | Site: Hip | Laterality: Left | Wound class: Clean

## 2011-10-28 MED ORDER — ONDANSETRON HCL 4 MG/2ML IJ SOLN: 4.0000 mg | Freq: Four times a day (QID) | INTRAMUSCULAR | Status: DC | PRN

## 2011-10-28 MED ORDER — CEFAZOLIN SODIUM 1-5 GM-% IV SOLN
1.0000 g | INTRAVENOUS | Status: AC
  Administered 2011-10-28: 1 g via INTRAVENOUS
  Filled 2011-10-28: qty 50

## 2011-10-28 MED ORDER — DONEPEZIL HCL 10 MG PO TABS
10.0000 mg | ORAL_TABLET | Freq: Every day | ORAL | Status: DC
  Administered 2011-10-28 – 2011-10-31 (×4): 10 mg via ORAL
  Filled 2011-10-28 (×6): qty 1

## 2011-10-28 MED ORDER — ACETAMINOPHEN 325 MG PO TABS
650.0000 mg | ORAL_TABLET | Freq: Four times a day (QID) | ORAL | Status: DC | PRN
  Administered 2011-10-30: 650 mg via ORAL

## 2011-10-28 MED ORDER — MENTHOL 3 MG MT LOZG
1.0000 | LOZENGE | OROMUCOSAL | Status: DC | PRN
  Filled 2011-10-28: qty 9

## 2011-10-28 MED ORDER — SODIUM CHLORIDE 0.9 % IV SOLN
INTRAVENOUS | Status: DC | PRN
  Administered 2011-10-28 (×2): via INTRAVENOUS

## 2011-10-28 MED ORDER — BACID PO TABS
1.0000 | ORAL_TABLET | Freq: Every day | ORAL | Status: DC
  Administered 2011-10-28 – 2011-11-01 (×5): 1 via ORAL
  Filled 2011-10-28 (×5): qty 1

## 2011-10-28 MED ORDER — PATIENT'S GUIDE TO USING COUMADIN BOOK
Freq: Once | Status: DC
  Filled 2011-10-28: qty 1

## 2011-10-28 MED ORDER — LACTOBACILLUS RHAMNOSUS (GG) 10 B CELL PO CAPS: 1.0000 | ORAL_CAPSULE | Freq: Every day | ORAL | Status: DC

## 2011-10-28 MED ORDER — DOXERCALCIFEROL 0.5 MCG PO CAPS
0.5000 ug | ORAL_CAPSULE | ORAL | Status: DC
  Administered 2011-11-01: 0.5 ug via ORAL
  Filled 2011-10-28 (×2): qty 1

## 2011-10-28 MED ORDER — ONDANSETRON HCL 4 MG/2ML IJ SOLN
INTRAMUSCULAR | Status: DC | PRN
  Administered 2011-10-28: 4 mg via INTRAVENOUS

## 2011-10-28 MED ORDER — WARFARIN - PHARMACIST DOSING INPATIENT: Freq: Every day | Status: DC

## 2011-10-28 MED ORDER — HYDRALAZINE HCL 20 MG/ML IJ SOLN
10.0000 mg | INTRAMUSCULAR | Status: DC | PRN
  Administered 2011-10-28: 10 mg via INTRAVENOUS
  Filled 2011-10-28 (×2): qty 0.5

## 2011-10-28 MED ORDER — SODIUM CHLORIDE 0.9 % IV SOLN
INTRAVENOUS | Status: DC
  Administered 2011-10-28 – 2011-10-29 (×2): via INTRAVENOUS
  Administered 2011-10-30: 1000 mL via INTRAVENOUS
  Administered 2011-10-31 (×2): via INTRAVENOUS

## 2011-10-28 MED ORDER — WARFARIN SODIUM 3 MG PO TABS
3.0000 mg | ORAL_TABLET | Freq: Every day | ORAL | Status: DC
  Administered 2011-10-28 – 2011-10-30 (×3): 3 mg via ORAL
  Filled 2011-10-28 (×4): qty 1

## 2011-10-28 MED ORDER — PHENYLEPHRINE HCL 10 MG/ML IJ SOLN
INTRAMUSCULAR | Status: DC | PRN
  Administered 2011-10-28: 80 ug via INTRAVENOUS

## 2011-10-28 MED ORDER — CALCIUM CARBONATE 1250 MG/5ML PO SUSP
500.0000 mg | Freq: Every day | ORAL | Status: DC
  Administered 2011-10-28 – 2011-11-01 (×5): 500 mg via ORAL
  Filled 2011-10-28 (×5): qty 5

## 2011-10-28 MED ORDER — GLYCOPYRROLATE 0.2 MG/ML IJ SOLN
INTRAMUSCULAR | Status: DC | PRN
  Administered 2011-10-28: .4 mg via INTRAVENOUS

## 2011-10-28 MED ORDER — VITAMIN D3 25 MCG (1000 UNIT) PO TABS
1000.0000 [IU] | ORAL_TABLET | Freq: Every day | ORAL | Status: DC
  Administered 2011-10-28 – 2011-11-01 (×5): 1000 [IU] via ORAL
  Filled 2011-10-28 (×5): qty 1

## 2011-10-28 MED ORDER — LEVOTHYROXINE SODIUM 100 MCG PO TABS
100.0000 ug | ORAL_TABLET | Freq: Every day | ORAL | Status: DC
  Administered 2011-10-28 – 2011-11-01 (×5): 100 ug via ORAL
  Filled 2011-10-28 (×6): qty 1

## 2011-10-28 MED ORDER — ACETAMINOPHEN 325 MG PO TABS
325.0000 mg | ORAL_TABLET | Freq: Every day | ORAL | Status: DC
  Administered 2011-10-28 – 2011-10-31 (×3): 325 mg via ORAL
  Filled 2011-10-28 (×4): qty 1
  Filled 2011-10-28: qty 2

## 2011-10-28 MED ORDER — ONDANSETRON HCL 4 MG PO TABS: 4.0000 mg | ORAL_TABLET | Freq: Four times a day (QID) | ORAL | Status: DC | PRN

## 2011-10-28 MED ORDER — ROCURONIUM BROMIDE 100 MG/10ML IV SOLN
INTRAVENOUS | Status: DC | PRN
  Administered 2011-10-28: 50 mg via INTRAVENOUS

## 2011-10-28 MED ORDER — MEMANTINE HCL 5 MG PO TABS
5.0000 mg | ORAL_TABLET | Freq: Two times a day (BID) | ORAL | Status: DC
  Administered 2011-10-28 – 2011-11-01 (×9): 5 mg via ORAL
  Filled 2011-10-28 (×11): qty 1

## 2011-10-28 MED ORDER — PROPOFOL 10 MG/ML IV EMUL
INTRAVENOUS | Status: DC | PRN
  Administered 2011-10-28: 110 mg via INTRAVENOUS

## 2011-10-28 MED ORDER — WARFARIN VIDEO: Freq: Once | Status: DC

## 2011-10-28 MED ORDER — 0.9 % SODIUM CHLORIDE (POUR BTL) OPTIME
TOPICAL | Status: DC | PRN
  Administered 2011-10-28: 1000 mL

## 2011-10-28 MED ORDER — LEVALBUTEROL HCL 0.63 MG/3ML IN NEBU
0.6300 mg | INHALATION_SOLUTION | Freq: Four times a day (QID) | RESPIRATORY_TRACT | Status: DC | PRN
  Filled 2011-10-28: qty 3

## 2011-10-28 MED ORDER — FENTANYL CITRATE 0.05 MG/ML IJ SOLN
INTRAMUSCULAR | Status: DC | PRN
  Administered 2011-10-28: 100 ug via INTRAVENOUS

## 2011-10-28 MED ORDER — ACETAMINOPHEN 650 MG RE SUPP: 650.0000 mg | Freq: Four times a day (QID) | RECTAL | Status: DC | PRN

## 2011-10-28 MED ORDER — NEOSTIGMINE METHYLSULFATE 1 MG/ML IJ SOLN
INTRAMUSCULAR | Status: DC | PRN
  Administered 2011-10-28: 3 mg via INTRAVENOUS

## 2011-10-28 MED ORDER — SODIUM CHLORIDE 0.9 % IJ SOLN
3.0000 mL | Freq: Two times a day (BID) | INTRAMUSCULAR | Status: DC
  Administered 2011-10-28 – 2011-10-31 (×5): 3 mL via INTRAVENOUS

## 2011-10-28 MED ORDER — CALCIUM CITRATE 950 (200 CA) MG PO TABS: 1.0000 | ORAL_TABLET | Freq: Every day | ORAL | Status: DC

## 2011-10-28 MED ORDER — CEFAZOLIN SODIUM 1-5 GM-% IV SOLN
1.0000 g | Freq: Four times a day (QID) | INTRAVENOUS | Status: AC
  Administered 2011-10-28 – 2011-10-29 (×3): 1 g via INTRAVENOUS
  Filled 2011-10-28 (×3): qty 50

## 2011-10-28 MED ORDER — LORATADINE 10 MG PO TABS
10.0000 mg | ORAL_TABLET | Freq: Every day | ORAL | Status: DC
  Administered 2011-10-28 – 2011-11-01 (×5): 10 mg via ORAL
  Filled 2011-10-28 (×5): qty 1

## 2011-10-28 MED ORDER — HYDROCODONE-ACETAMINOPHEN 5-325 MG PO TABS
1.0000 | ORAL_TABLET | ORAL | Status: DC | PRN
  Administered 2011-10-29 – 2011-11-01 (×2): 2 via ORAL
  Filled 2011-10-28 (×2): qty 2

## 2011-10-28 MED ORDER — HYDROMORPHONE HCL PF 1 MG/ML IJ SOLN: 0.2500 mg | INTRAMUSCULAR | Status: DC | PRN

## 2011-10-28 MED ORDER — FENTANYL CITRATE 0.05 MG/ML IJ SOLN
12.5000 ug | INTRAMUSCULAR | Status: DC | PRN
  Administered 2011-10-28: 12.5 ug via INTRAVENOUS
  Filled 2011-10-28 (×2): qty 2

## 2011-10-28 MED ORDER — SERTRALINE HCL 100 MG PO TABS
100.0000 mg | ORAL_TABLET | Freq: Every day | ORAL | Status: DC
  Administered 2011-10-28 – 2011-11-01 (×5): 100 mg via ORAL
  Filled 2011-10-28 (×5): qty 1

## 2011-10-28 MED ORDER — LIDOCAINE HCL (CARDIAC) 20 MG/ML IV SOLN
INTRAVENOUS | Status: DC | PRN
  Administered 2011-10-28: 60 mg via INTRAVENOUS

## 2011-10-28 SURGICAL SUPPLY — 49 items
BANDAGE GAUZE ELAST BULKY 4 IN (GAUZE/BANDAGES/DRESSINGS) IMPLANT
BIT DRILL 5 ACE CANN QC (BIT) ×2 IMPLANT
BLADE SURG 10 STRL SS (BLADE) ×2 IMPLANT
BNDG COHESIVE 4X5 TAN STRL (GAUZE/BANDAGES/DRESSINGS) IMPLANT
CHLORAPREP W/TINT 26ML (MISCELLANEOUS) ×2 IMPLANT
CLOTH BEACON ORANGE TIMEOUT ST (SAFETY) ×2 IMPLANT
COVER MAYO STAND STRL (DRAPES) ×2 IMPLANT
COVER SURGICAL LIGHT HANDLE (MISCELLANEOUS) ×2 IMPLANT
DRAPE ORTHO SPLIT 77X108 STRL (DRAPES)
DRAPE STERI IOBAN 125X83 (DRAPES) ×2 IMPLANT
DRAPE SURG 17X23 STRL (DRAPES) IMPLANT
DRAPE SURG ORHT 6 SPLT 77X108 (DRAPES) IMPLANT
DRSG MEPILEX BORDER 4X4 (GAUZE/BANDAGES/DRESSINGS) ×2 IMPLANT
DRSG PAD ABDOMINAL 8X10 ST (GAUZE/BANDAGES/DRESSINGS) ×4 IMPLANT
ELECT REM PT RETURN 9FT ADLT (ELECTROSURGICAL) ×2
ELECTRODE REM PT RTRN 9FT ADLT (ELECTROSURGICAL) ×1 IMPLANT
GLOVE BIO SURGEON STRL SZ7 (GLOVE) ×2 IMPLANT
GLOVE BIO SURGEON STRL SZ7.5 (GLOVE) ×2 IMPLANT
GLOVE BIOGEL PI IND STRL 7.5 (GLOVE) ×1 IMPLANT
GLOVE BIOGEL PI IND STRL 8 (GLOVE) ×1 IMPLANT
GLOVE BIOGEL PI INDICATOR 7.5 (GLOVE) ×1
GLOVE BIOGEL PI INDICATOR 8 (GLOVE) ×1
GOWN PREVENTION PLUS XLARGE (GOWN DISPOSABLE) ×2 IMPLANT
GOWN STRL NON-REIN LRG LVL3 (GOWN DISPOSABLE) ×2 IMPLANT
GUIDEWIRE BALL NOSE 80CM (WIRE) IMPLANT
KIT ROOM TURNOVER OR (KITS) ×2 IMPLANT
MANIFOLD NEPTUNE II (INSTRUMENTS) ×2 IMPLANT
NS IRRIG 1000ML POUR BTL (IV SOLUTION) ×2 IMPLANT
PACK GENERAL/GYN (CUSTOM PROCEDURE TRAY) ×2 IMPLANT
PAD ARMBOARD 7.5X6 YLW CONV (MISCELLANEOUS) ×4 IMPLANT
PAD CAST 4YDX4 CTTN HI CHSV (CAST SUPPLIES) IMPLANT
PADDING CAST COTTON 4X4 STRL (CAST SUPPLIES)
PIN THREADED GUIDE ACE (PIN) ×6 IMPLANT
SCREW CANN 6.5 70MM (Screw) ×2 IMPLANT
SCREW CANN 6.5 75MM (Screw) ×2 IMPLANT
SCREW CANN 6.5 80MM (Screw) ×2 IMPLANT
SCREW CANN LG 6.5 FLT 70X22 (Screw) ×1 IMPLANT
SCREW CANN LG 6.5 FLT 75X22 (Screw) ×1 IMPLANT
SCREW CANN LG 6.5 FLT 80X22 (Screw) ×1 IMPLANT
STAPLER VISISTAT 35W (STAPLE) ×2 IMPLANT
STRIP CLOSURE SKIN 1/2X4 (GAUZE/BANDAGES/DRESSINGS) ×2 IMPLANT
SUT MNCRL AB 4-0 PS2 18 (SUTURE) ×2 IMPLANT
SUT VIC AB 1 CT1 27 (SUTURE)
SUT VIC AB 1 CT1 27XBRD ANBCTR (SUTURE) IMPLANT
SUT VIC AB 2-0 CT1 27 (SUTURE) ×2
SUT VIC AB 2-0 CT1 TAPERPNT 27 (SUTURE) ×1 IMPLANT
TOWEL OR 17X24 6PK STRL BLUE (TOWEL DISPOSABLE) ×2 IMPLANT
TOWEL OR 17X26 10 PK STRL BLUE (TOWEL DISPOSABLE) ×4 IMPLANT
WATER STERILE IRR 1000ML POUR (IV SOLUTION) ×2 IMPLANT

## 2011-10-28 NOTE — Anesthesia Preprocedure Evaluation (Addendum)
Anesthesia Evaluation  Patient identified by MRN, date of birth, ID band Patient awake    Reviewed: Allergy & Precautions, H&P , NPO status , Patient's Chart, lab work & pertinent test results  Airway Mallampati: I TM Distance: >3 FB Neck ROM: Full    Dental  (+) Teeth Intact   Pulmonary COPDformer smoker         Cardiovascular hypertension,     Neuro/Psych PSYCHIATRIC DISORDERS Depression dementiaDementia    GI/Hepatic   Endo/Other  Hypothyroidism Hyperthyroidism   Renal/GU Renal InsufficiencyRenal disease     Musculoskeletal   Abdominal   Peds  Hematology   Anesthesia Other Findings   Reproductive/Obstetrics                         Anesthesia Physical Anesthesia Plan  ASA: III  Anesthesia Plan: General   Post-op Pain Management:    Induction: Intravenous  Airway Management Planned: Oral ETT  Additional Equipment:   Intra-op Plan:   Post-operative Plan: Extubation in OR  Informed Consent: I have reviewed the patients History and Physical, chart, labs and discussed the procedure including the risks, benefits and alternatives for the proposed anesthesia with the patient or authorized representative who has indicated his/her understanding and acceptance.   Dental advisory given  Plan Discussed with: CRNA, Anesthesiologist and Surgeon  Anesthesia Plan Comments:         Anesthesia Quick Evaluation

## 2011-10-28 NOTE — Telephone Encounter (Signed)
Tonny Branch called today to give [place] a heads up, that her mother Ms Claudia [name] has fallen & broken her left hip. The did take her to the hospital, they are doing surgery today & placing 3-screws in her hip Should you need any more information please call  Tonny Branch @ [phone]

## 2011-10-28 NOTE — Transfer of Care (Signed)
Immediate Anesthesia Transfer of Care Note  Patient: Claudia [name]  Procedure(s) Performed: Procedure(s) (LRB): CANNULATED HIP PINNING (Left)  Patient Location: PACU  Anesthesia Type: General  Level of Consciousness: awake and alert   Airway & Oxygen Therapy: Patient Spontanous Breathing and Patient connected to nasal cannula oxygen  Post-op Assessment: Report given to PACU RN and Post -op Vital signs reviewed and stable  Post vital signs: Reviewed and stable  Complications: No apparent anesthesia complications

## 2011-10-28 NOTE — Consult Note (Signed)
WOC consult Note Reason for Consult: eval. Wounds bil. LE. Pts daughter reports "cellulitis" in LE.  Has been followed by wound care center.  Has coban and cast padding to RLE, coban and kerlix to LLE.  No open wounds noted to the RLE.  LLE has one large area with skin avulsion posterior achilles/calf. Daughter reports pt has had swelling of bil. LE, does not present with any edema today Wound type: avulsion of skin, possible ruptured blister.  Measurement: 6.0cm x3.0cm x 0.2cm Wound bed: pink, moist, has skin flap present over a portion of the wound Drainage (amount, consistency, odor) yellow-green moderate amount Periwound: intact without problems Dressing procedure/placement/frequency: no dressing placed to the RLE, LLE placed silicone foam dressing to open wound for management of exudate, protection and insulation.  Will rewrap bil. LE's with kerlix and coban for edema tom. After orthopedic surgery.  Discussed POC with daughter and pt.   Re consult if needed, will not follow at this time. Thanks  Jadalynn Burr [employer], CWOCN [phone])

## 2011-10-28 NOTE — H&P (Signed)
Claudia [name] is an 78 y.o. female.   PCP - Dr.Jose Paz. History obtained from patient's daughter. Chief Complaint: Pain in the hip area. HPI: 78 year old female with advanced dementia, chronic kidney disease, hypothyroidism, hypertension, COPD had a fall on Monday at her assisted living facility. After which patient has been experiencing pain in the hip area. Since the pain was not getting better and patient was not able to ambulate due to intense pain patient was brought to the ER at [facility]. X-rays revealed left sided subcapital fracture and patient has been admitted for further management. As per patient's daughter the initial fall was not noticed and patient was found to floor on Monday morning. Patient at this time does not complain of any chest pain shortness of breath any headache or any nausea vomiting or abdominal pain. Patient was until recently on antibiotics for her chronic lower extremity wounds for which she also follows the wound clinic.  Past Medical History  Diagnosis Date  . COPD (chronic obstructive pulmonary disease)   . Hyperlipidemia   . Hypertension   . Dementia   . Renal insufficiency   . Breast cancer   . Hypothyroidism   . Osteopenia     last dexa aprox 2008- per daughter  . Osteoarthritis   . Depression   . Stroke     Multiple TIAs in the past   . Peripheral vascular disease     Diminished circulation in both legs    Past Surgical History  Procedure Date  . Appendectomy   . Abdominal hysterectomy   . Tonsillectomy     History reviewed. No pertinent family history. Social History:  reports that she has quit smoking. Her smoking use included Cigarettes. She does not have any smokeless tobacco history on file. She reports that she drinks alcohol. She reports that she does not use illicit drugs.  Allergies:  Allergies  Allergen Reactions  . ZOX:[id number]+XBJYNWGNF+AOZHYQMVHQ Acid+Aspartame   . Sulfonamide Derivatives     Medications  Prior to Admission  Medication Dose Route Frequency Provider Last Rate Last Dose  . 0.9 %  sodium chloride infusion   Intravenous STAT Gwyneth Sprout, MD 100 mL/hr at 10/27/11 1845    . 0.9 %  sodium chloride infusion   Intravenous Continuous Eduard Clos, MD      . acetaminophen (TYLENOL) tablet 650 mg  650 mg Oral Q6H PRN Eduard Clos, MD       Or  . acetaminophen (TYLENOL) suppository 650 mg  650 mg Rectal Q6H PRN Eduard Clos, MD      . acetaminophen (TYLENOL) tablet 325 mg  325 mg Oral QHS Eduard Clos, MD      . calcium carbonate (dosed in mg elemental calcium) suspension 500 mg of elemental calcium  500 mg of elemental calcium Oral Daily Eduard Clos, MD      . calcium citrate (CALCITRATE - dosed in mg elemental calcium) tablet 200 mg of elemental calcium  1 tablet Oral Daily Eduard Clos, MD      . cholecalciferol (VITAMIN D) tablet 1,000 Units  1,000 Units Oral Daily Eduard Clos, MD      . donepezil (ARICEPT) tablet 10 mg  10 mg Oral QHS Eduard Clos, MD      . doxercalciferol (HECTOROL) capsule 0.5 mcg  0.5 mcg Oral 3 times weekly Eduard Clos, MD      . fentaNYL (SUBLIMAZE) injection 12.5 mcg  12.5 mcg Intravenous Q4H  PRN Eduard Clos, MD      . hydrALAZINE (APRESOLINE) injection 10 mg  10 mg Intravenous Q4H PRN Eduard Clos, MD      . lactobacillus rhamnosus (GG) (CULTURELLE) capsule 1 capsule  1 capsule Oral Daily Eduard Clos, MD      . levalbuterol [facility]) nebulizer solution 0.63 mg  0.63 mg Nebulization Q6H PRN Eduard Clos, MD      . levothyroxine (SYNTHROID, LEVOTHROID) tablet 100 mcg  100 mcg Oral Daily Eduard Clos, MD      . loratadine (CLARITIN) tablet 10 mg  10 mg Oral Daily Eduard Clos, MD      . memantine [facility]) tablet 5 mg  5 mg Oral BID Eduard Clos, MD      . ondansetron [facility]) tablet 4 mg  4 mg Oral Q6H PRN Eduard Clos, MD       Or  .  ondansetron [facility]) injection 4 mg  4 mg Intravenous Q6H PRN Eduard Clos, MD      . sertraline (ZOLOFT) tablet 100 mg  100 mg Oral Daily Eduard Clos, MD      . sodium chloride 0.9 % bolus 1,000 mL  1,000 mL Intravenous Once Gwyneth Sprout, MD   1,000 mL at 10/27/11 1535  . sodium chloride 0.9 % injection 3 mL  3 mL Intravenous Q12H Eduard Clos, MD       Medications Prior to Admission  Medication Sig Dispense Refill  . acetaminophen (TYLENOL) 325 MG tablet Take 1 tablet (325 mg total) by mouth at bedtime.  30 tablet  6  . AMBULATORY NON FORMULARY MEDICATION aveeno OTC twice a day, keep LEFT  leg elevated at least 1 hour twice a day      . aspirin 81 MG tablet Take 81 mg by mouth every other day.       . Azelastine HCl (ASTEPRO) 0.15 % SOLN Place 1 spray into the nose as needed.       . cetirizine (ZYRTEC) 10 MG tablet Take 10 mg by mouth daily.        Marland Kitchen donepezil (ARICEPT) 10 MG tablet Take 10 mg by mouth at bedtime.        Marland Kitchen doxercalciferol (HECTOROL) 0.5 MCG capsule 1 po M,W,F.       . lactobacillus rhamnosus, GG, (CULTURELLE) 10 B CELL capsule Take 1 capsule by mouth daily.        Marland Kitchen levothyroxine (SYNTHROID, LEVOTHROID) 100 MCG tablet Take 1 tablet (100 mcg total) by mouth daily.  30 tablet  0  . memantine (NAMENDA) 5 MG tablet Take 5 mg by mouth 2 (two) times daily.        . Multiple Vitamins-Minerals (CENTRUM ULTRA WOMENS PO) Take by mouth.        . triamterene-hydrochlorothiazide (DYAZIDE) 37.5-25 MG per capsule Take 1 capsule by mouth daily.        . calcium carbonate, dosed in mg elemental calcium, 1250 MG/5ML Take 500 mg of elemental calcium by mouth daily.        . Loperamide-Simethicone (IMODIUM ADVANCED) 2-125 MG TABS Take by mouth as needed. No more than 3/day         Results for orders placed during the hospital encounter of 10/27/11 (from the past 48 hour(s))  CBC     Status: Abnormal   Collection Time   10/27/11  3:11 PM      Component Value Range  Comment  WBC 13.8 (*) 4.0 - 10.5 (K/uL)    RBC 4.68  3.87 - 5.11 (MIL/uL)    Hemoglobin 13.9  12.0 - 15.0 (g/dL)    HCT 16.1  09.6 - 04.5 (%)    MCV 87.8  78.0 - 100.0 (fL)    MCH 29.7  26.0 - 34.0 (pg)    MCHC 33.8  30.0 - 36.0 (g/dL)    RDW 40.9 (*) 81.1 - 15.5 (%)    Platelets 145 (*) 150 - 400 (K/uL)   DIFFERENTIAL     Status: Abnormal   Collection Time   10/27/11  3:11 PM      Component Value Range Comment   Neutrophils Relative 79 (*) 43 - 77 (%)    Neutro Abs 10.9 (*) 1.7 - 7.7 (K/uL)    Lymphocytes Relative 10 (*) 12 - 46 (%)    Lymphs Abs 1.4  0.7 - 4.0 (K/uL)    Monocytes Relative 10  3 - 12 (%)    Monocytes Absolute 1.4 (*) 0.1 - 1.0 (K/uL)    Eosinophils Relative 1  0 - 5 (%)    Eosinophils Absolute 0.2  0.0 - 0.7 (K/uL)    Basophils Relative 0  0 - 1 (%)    Basophils Absolute 0.0  0.0 - 0.1 (K/uL)   COMPREHENSIVE METABOLIC PANEL     Status: Abnormal   Collection Time   10/27/11  3:11 PM      Component Value Range Comment   Sodium 144  135 - 145 (mEq/L)    Potassium 3.2 (*) 3.5 - 5.1 (mEq/L)    Chloride 106  96 - 112 (mEq/L)    CO2 28  19 - 32 (mEq/L)    Glucose, Bld 106 (*) 70 - 99 (mg/dL)    BUN 31 (*) 6 - 23 (mg/dL)    Creatinine, Ser 9.14 (*) 0.50 - 1.10 (mg/dL)    Calcium 9.5  8.4 - 10.5 (mg/dL)    Total Protein 6.4  6.0 - 8.3 (g/dL)    Albumin 3.0 (*) 3.5 - 5.2 (g/dL)    AST 54 (*) 0 - 37 (U/L)    ALT 22  0 - 35 (U/L)    Alkaline Phosphatase 63  39 - 117 (U/L)    Total Bilirubin 0.6  0.3 - 1.2 (mg/dL)    GFR calc non Af Amer 26 (*) >90 (mL/min)    GFR calc Af Amer 30 (*) >90 (mL/min)   URINALYSIS, ROUTINE W REFLEX MICROSCOPIC     Status: Abnormal   Collection Time   10/27/11  3:35 PM      Component Value Range Comment   Color, Urine YELLOW  YELLOW     APPearance CLEAR  CLEAR     Specific Gravity, Urine 1.027  1.005 - 1.030     pH 6.0  5.0 - 8.0     Glucose, UA NEGATIVE  NEGATIVE (mg/dL)    Hgb urine dipstick TRACE (*) NEGATIVE     Bilirubin Urine  NEGATIVE  NEGATIVE     Ketones, ur 15 (*) NEGATIVE (mg/dL)    Protein, ur 782 (*) NEGATIVE (mg/dL)    Urobilinogen, UA 0.2  0.0 - 1.0 (mg/dL)    Nitrite NEGATIVE  NEGATIVE     Leukocytes, UA NEGATIVE  NEGATIVE    URINE MICROSCOPIC-ADD ON     Status: Abnormal   Collection Time   10/27/11  3:35 PM      Component Value Range Comment   Squamous Epithelial /  LPF RARE  RARE     WBC, UA 3-6  <3 (WBC/hpf)    RBC / HPF 0-2  <3 (RBC/hpf)    Bacteria, UA MANY (*) RARE     Dg Chest 1 View  10/27/2011  *RADIOLOGY REPORT*  Clinical Data: Larey Seat on Monday with left hip pain  CHEST - 1 VIEW  Comparison: Chest x-ray of 07/17/2009  Findings: The lungs are clear.  The heart is mildly enlarged.  The bones are osteopenic.  Surgical clips overlie the left axilla.  IMPRESSION: No active lung disease.  Mild cardiomegaly.  Osteopenia.  Original Report Authenticated By: Juline Patch, M.D.   Dg Hip Complete Left  10/27/2011  *RADIOLOGY REPORT*  Clinical Data: Status post fall.  Pain.  LEFT HIP - COMPLETE 2+ VIEW  Comparison: Plain film the pelvis 08/11/2009.  Findings: The patient has an acute subcapital fracture of the left hip.  No other fracture is identified.  Femoral heads are located bilaterally.  IMPRESSION: Acute subcapital fracture left hip.  Per CMS PQRS reporting requirements (PQRS Measure 24): Given the patient's age of greater than 50 and the fracture site (hip, distal radius, or spine), the patient should be tested for osteoporosis using DXA, and the appropriate treatment considered based on the DXA results.  Original Report Authenticated By: Bernadene Bell. Maricela Curet, M.D.   Ct Head Wo Contrast  10/27/2011  *RADIOLOGY REPORT*  Clinical Data: .  Shaking.  Recent fall.  History of strokes. History of hypertension, breast cancer and dementia.  CT HEAD WITHOUT CONTRAST  Technique:  Contiguous axial images were obtained from the base of the skull through the vertex without contrast.  Comparison: 08/14/2010 and previous   Findings: The brain shows generalized atrophy.  There are extensive old small vessel infarctions throughout the hemispheric white matter.  No sign of acute infarction, mass lesion, hemorrhage, hydrocephalus or extra-axial collection.  No calvarial lesion. Small calvarial lucencies have not changed.  No skull fracture.  No fluid in the sinuses, middle ears or mastoids.  IMPRESSION: No acute or traumatic finding.  Atrophy and extensive chronic small vessel disease throughout the brain.  Original Report Authenticated By: Thomasenia Sales, M.D.    Review of Systems  Constitutional: Negative.   HENT: Negative.   Eyes: Negative.   Respiratory: Negative.   Cardiovascular: Negative.   Gastrointestinal: Negative.   Genitourinary: Negative.   Musculoskeletal:       Left hip pain.  Skin: Negative.   Neurological: Negative.   Psychiatric/Behavioral: Negative.     Blood pressure 150/82, pulse 87, temperature 97.9 F (36.6 C), temperature source Oral, resp. rate 18, weight 56.1 kg (123 lb 10.9 oz), SpO2 98.00%. Physical Exam  Constitutional: She appears well-developed and well-nourished. No distress.  HENT:  Head: Normocephalic and atraumatic.  Right Ear: External ear normal.  Left Ear: External ear normal.  Mouth/Throat: No oropharyngeal exudate.  Eyes: Conjunctivae are normal. Pupils are equal, round, and reactive to light. Right eye exhibits no discharge. Left eye exhibits no discharge.  Neck: Normal range of motion. Neck supple.  Cardiovascular:       Mild tachycardia.  Respiratory: Effort normal and breath sounds normal. No respiratory distress. She has no wheezes.  GI: Soft. Bowel sounds are normal. She exhibits no distension. There is no tenderness. There is no rebound.  Musculoskeletal:       Pain on moving lower extremities.  Neurological: She is alert.       Oriented to her name and is able  to recognize her daughter. Follows commands. Moves all extremities but lower extremities are  limited due to pain.  Skin: She is not diaphoretic.       Chronic skin wounds of lower extremities.     Assessment/Plan #1. Left sided subcapital fracture - at this time patient will be placed on when necessary pain medications. I have discussed with on-call orthopedic surgeon Dr. Ave Filter who will be discussed with patient's daughter about further plan. #2. Mild leukocytosis - patient this time is afebrile. Closely follow CBC. #3. History of COPD, hypertension, advanced dementia, chronic kidney disease - stable. At this time hold her diuretics and for hypertension I have patient on when necessary hydralazine for systolic blood pressure more than 160. Continue nebulizer for COPD as needed.  CODE STATUS - DO NOT RESUSCITATE.  Yides Saidi N. 10/28/2011, 1:28 AM

## 2011-10-28 NOTE — Telephone Encounter (Signed)
Noted, thank you

## 2011-10-28 NOTE — Preoperative (Signed)
Beta Blockers   Reason not to administer Beta Blockers:Not Applicable 

## 2011-10-28 NOTE — Op Note (Signed)
10/27/2011 - 10/28/2011  4:48 PM  PATIENT:  Claudia [name]    PRE-OPERATIVE DIAGNOSIS:  Left Femoral Neck Fracture  POST-OPERATIVE DIAGNOSIS:  Same  PROCEDURE:  CANNULATED HIP PINNING  SURGEON:  Mable Paris, MD  PHYSICIAN ASSISTANT: none  ANESTHESIA:   General  PREOPERATIVE INDICATIONS:  [name] is a  78 y.o. female who fell and was found to have a diagnosis of Left Femoral Neck Fracture who elected for surgical management.    The risks benefits and alternatives were discussed with the patient preoperatively including but not limited to the risks of infection, bleeding, nerve injury, cardiopulmonary complications, blood clots, malunion, nonunion, avascular necrosis, the need for revision surgery, the potential for conversion to hemiarthroplasty, among others, and the patient was willing to proceed.  OPERATIVE IMPLANTS: 6.5 mm cannulated screws x3  OPERATIVE FINDINGS: Clinical osteoporosis with weak bone, proximal femur  OPERATIVE PROCEDURE: The patient was brought to the operating room and placed in supine position. IV antibiotics were given. General anesthesia administered. Foley was also given. She was placed on the fracture table. The left lower extremity was positioned, without any significant reduction maneuver. The left lower extremity was prepped and draped in usual sterile fashion.  Time out was performed.  Small incision was made distal to the greater trochanter, and 3 guidewires were introduced Into a triangle configuration. The lengths were measured. The reduction was slightly valgus, and near-anatomic. I opened the cortex with a cannulated drill, and then placed the screws into position. Satisfactory fixation was achieved.  The wounds were irrigated copiously, and repaired with Vicryl, monocryl and Steri-Strips and sterile gauze. There no complications and the patient tolerated the procedure well.  The patient will be weightbearing as tolerated, and will be on  Coumadin for a period of one month postoperatively.

## 2011-10-28 NOTE — Progress Notes (Signed)
ANTICOAGULATION CONSULT NOTE - Initial Consult  Pharmacy Consult for Coumadin Indication: VTE prophylaxis s/p hip pinning  Allergies  Allergen Reactions  . ZOX:[id number] Acid+Aspartame   . Sulfonamide Derivatives     Patient Measurements: Weight: 123 lb 10.9 oz (56.1 kg)  Vital Signs: Temp: 97.2 F (36.2 C) (03/21 1700) Temp src: Oral (03/21 1502) BP: 184/96 mmHg (03/21 1800) Pulse Rate: 100  (03/21 1800)  Labs:  Basename 10/28/11 0655 10/27/11 1511  HGB 13.7 13.9  HCT 41.3 41.1  PLT 138* 145*  APTT -- --  LABPROT -- --  INR -- --  HEPARINUNFRC -- --  CREATININE 1.31* 1.70*  CKTOTAL -- --  CKMB -- --  TROPONINI -- --   The CrCl is unknown because both a height and weight (above a minimum accepted value) are required for this calculation.  Medical History: Past Medical History  Diagnosis Date  . COPD (chronic obstructive pulmonary disease)   . Hyperlipidemia   . Hypertension   . Dementia   . Renal insufficiency   . Breast cancer   . Hypothyroidism   . Osteopenia     last dexa aprox 2008- per daughter  . Osteoarthritis   . Depression   . Stroke     Multiple TIAs in the past   . Peripheral vascular disease     Diminished circulation in both legs    Medications:  Prescriptions prior to admission  Medication Sig Dispense Refill  . acetaminophen (TYLENOL) 325 MG tablet Take 1 tablet (325 mg total) by mouth at bedtime.  30 tablet  6  . AMBULATORY NON FORMULARY MEDICATION aveeno OTC twice a day, keep LEFT  leg elevated at least 1 hour twice a day      . aspirin 81 MG tablet Take 81 mg by mouth every other day.       . Azelastine HCl (ASTEPRO) 0.15 % SOLN Place 1 spray into the nose as needed.       . calcium citrate (CALCITRATE - DOSED IN MG ELEMENTAL CALCIUM) 950 MG tablet Take 1 tablet by mouth daily.      . cetirizine (ZYRTEC) 10 MG tablet Take 10 mg by mouth daily.        . cholecalciferol (VITAMIN D) 1000 UNITS tablet Take 1,000  Units by mouth daily.      Marland Kitchen donepezil (ARICEPT) 10 MG tablet Take 10 mg by mouth at bedtime.        Marland Kitchen doxercalciferol (HECTOROL) 0.5 MCG capsule 1 po M,W,F.       . doxycycline (ADOXA) 100 MG tablet Take 100 mg by mouth 2 (two) times daily.      Marland Kitchen lactobacillus rhamnosus, GG, (CULTURELLE) 10 B CELL capsule Take 1 capsule by mouth daily.        Marland Kitchen levothyroxine (SYNTHROID, LEVOTHROID) 100 MCG tablet Take 1 tablet (100 mcg total) by mouth daily.  30 tablet  0  . memantine (NAMENDA) 5 MG tablet Take 5 mg by mouth 2 (two) times daily.        . Multiple Vitamins-Minerals (CENTRUM ULTRA WOMENS PO) Take by mouth.        . sertraline (ZOLOFT) 100 MG tablet Take 100 mg by mouth daily.      . traMADol (ULTRAM) 50 MG tablet Take 50 mg by mouth every 6 (six) hours as needed. Patient is given this medication for pain.      Marland Kitchen triamterene-hydrochlorothiazide (DYAZIDE) 37.5-25 MG per capsule Take 1 capsule by mouth daily.        Marland Kitchen  calcium carbonate, dosed in mg elemental calcium, 1250 MG/5ML Take 500 mg of elemental calcium by mouth daily.        . Loperamide-Simethicone (IMODIUM ADVANCED) 2-125 MG TABS Take by mouth as needed. No more than 3/day         Assessment: 78 year old s/p hip pinning, to begin Coumadin for VTE prophylaxis  Goal of Therapy:  INR = 1.5 to 2.5   Plan:  1) Coumadin 3 mg po daily at 1800 2) Daily INR 3) Coumadin education  Elwin Sleight 10/28/2011,6:23 PM

## 2011-10-28 NOTE — Consult Note (Signed)
Reason for Consult: Left hip fracture Referring Physician: Dr. Burr Medico Claudia is an 78 y.o. [name].  HPI: 78 year old [name] with history of dementia and multiple medical comorbidities who normally stays at a nursing facility. Claudia [name] was found on the ground after a fall 3 days ago. Claudia [name] initially was felt to be without significant injury but over the last 2 days has complained of more pain in the left hip and has not been able to bear weight. Claudia [name] was seen in the emergency department where they diagnosed her with a left hip fracture. Claudia [name] was admitted for pain management and consideration of surgery. Pain is worse with movement better with rest.  Past Medical History  Diagnosis Date  . COPD (chronic obstructive pulmonary disease)   . Hyperlipidemia   . Hypertension   . Dementia   . Renal insufficiency   . Breast cancer   . Hypothyroidism   . Osteopenia     last dexa aprox 2008- per daughter  . Osteoarthritis   . Depression   . Stroke     Multiple TIAs in the past   . Peripheral vascular disease     Diminished circulation in both legs    Past Surgical History  Procedure Date  . Appendectomy   . Abdominal hysterectomy   . Tonsillectomy     History reviewed. No pertinent family history.  Social History:  reports that Claudia [name] has quit smoking. Her smoking use included Cigarettes. Claudia [name] does not have any smokeless tobacco history on file. Claudia [name] reports that Claudia [name] drinks alcohol. Claudia [name] reports that Claudia [name] does not use illicit drugs.  Allergies:  Allergies  Allergen Reactions  . NGE:XBMWUXLKGMW+NUUVOZDGU+[id number] Acid+Aspartame   . Sulfonamide Derivatives     Medications: I have reviewed the patient's current medications.  Results for orders placed during the hospital encounter of 10/27/11 (from the past 48 hour(s))  CBC     Status: Abnormal   Collection Time   10/27/11  3:11 PM      Component Value Range Comment   WBC 13.8 (*) 4.0 - 10.5 (K/uL)    RBC 4.68  3.87 - 5.11 (MIL/uL)    Hemoglobin 13.9  12.0 - 15.0 (g/dL)    HCT 56.3  87.5 - 64.3 (%)    MCV 87.8  78.0 - 100.0 (fL)    MCH 29.7  26.0 - 34.0 (pg)    MCHC 33.8  30.0 - 36.0 (g/dL)    RDW 32.9 (*) 51.8 - 15.5 (%)    Platelets 145 (*) 150 - 400 (K/uL)   DIFFERENTIAL     Status: Abnormal   Collection Time   10/27/11  3:11 PM      Component Value Range Comment   Neutrophils Relative 79 (*) 43 - 77 (%)    Neutro Abs 10.9 (*) 1.7 - 7.7 (K/uL)    Lymphocytes Relative 10 (*) 12 - 46 (%)    Lymphs Abs 1.4  0.7 - 4.0 (K/uL)    Monocytes Relative 10  3 - 12 (%)    Monocytes Absolute 1.4 (*) 0.1 - 1.0 (K/uL)    Eosinophils Relative 1  0 - 5 (%)    Eosinophils Absolute 0.2  0.0 - 0.7 (K/uL)    Basophils Relative 0  0 - 1 (%)    Basophils Absolute 0.0  0.0 - 0.1 (K/uL)   COMPREHENSIVE METABOLIC PANEL     Status: Abnormal   Collection Time   10/27/11  3:11 PM      Component Value Range  Comment   Sodium 144  135 - 145 (mEq/L)    Potassium 3.2 (*) 3.5 - 5.1 (mEq/L)    Chloride 106  96 - 112 (mEq/L)    CO2 28  19 - 32 (mEq/L)    Glucose, Bld 106 (*) 70 - 99 (mg/dL)    BUN 31 (*) 6 - 23 (mg/dL)    Creatinine, Ser 1.47 (*) 0.50 - 1.10 (mg/dL)    Calcium 9.5  8.4 - 10.5 (mg/dL)    Total Protein 6.4  6.0 - 8.3 (g/dL)    Albumin 3.0 (*) 3.5 - 5.2 (g/dL)    AST 54 (*) 0 - 37 (U/L)    ALT 22  0 - 35 (U/L)    Alkaline Phosphatase 63  39 - 117 (U/L)    Total Bilirubin 0.6  0.3 - 1.2 (mg/dL)    GFR calc non Af Amer 26 (*) >90 (mL/min)    GFR calc Af Amer 30 (*) >90 (mL/min)   URINALYSIS, ROUTINE W REFLEX MICROSCOPIC     Status: Abnormal   Collection Time   10/27/11  3:35 PM      Component Value Range Comment   Color, Urine YELLOW  YELLOW     APPearance CLEAR  CLEAR     Specific Gravity, Urine 1.027  1.005 - 1.030     pH 6.0  5.0 - 8.0     Glucose, UA NEGATIVE  NEGATIVE (mg/dL)    Hgb urine dipstick TRACE (*) NEGATIVE     Bilirubin Urine NEGATIVE  NEGATIVE     Ketones, ur 15 (*) NEGATIVE (mg/dL)    Protein, ur 829  (*) NEGATIVE (mg/dL)    Urobilinogen, UA 0.2  0.0 - 1.0 (mg/dL)    Nitrite NEGATIVE  NEGATIVE     Leukocytes, UA NEGATIVE  NEGATIVE    URINE MICROSCOPIC-ADD ON     Status: Abnormal   Collection Time   10/27/11  3:35 PM      Component Value Range Comment   Squamous Epithelial / LPF RARE  RARE     WBC, UA 3-6  <3 (WBC/hpf)    RBC / HPF 0-2  <3 (RBC/hpf)    Bacteria, UA MANY (*) RARE    COMPREHENSIVE METABOLIC PANEL     Status: Abnormal   Collection Time   10/28/11  6:55 AM      Component Value Range Comment   Sodium 146 (*) 135 - 145 (mEq/L)    Potassium 3.1 (*) 3.5 - 5.1 (mEq/L)    Chloride 111  96 - 112 (mEq/L)    CO2 25  19 - 32 (mEq/L)    Glucose, Bld 84  70 - 99 (mg/dL)    BUN 25 (*) 6 - 23 (mg/dL)    Creatinine, Ser 5.62 (*) 0.50 - 1.10 (mg/dL)    Calcium 9.1  8.4 - 10.5 (mg/dL)    Total Protein 5.8 (*) 6.0 - 8.3 (g/dL)    Albumin 2.5 (*) 3.5 - 5.2 (g/dL)    AST 48 (*) 0 - 37 (U/L)    ALT 20  0 - 35 (U/L)    Alkaline Phosphatase 60  39 - 117 (U/L)    Total Bilirubin 0.6  0.3 - 1.2 (mg/dL)    GFR calc non Af Amer 35 (*) >90 (mL/min)    GFR calc Af Amer 41 (*) >90 (mL/min)   CBC     Status: Abnormal   Collection Time   10/28/11  6:55 AM  Component Value Range Comment   WBC 12.3 (*) 4.0 - 10.5 (K/uL)    RBC 4.64  3.87 - 5.11 (MIL/uL)    Hemoglobin 13.7  12.0 - 15.0 (g/dL)    HCT 14.7  82.9 - 56.2 (%)    MCV 89.0  78.0 - 100.0 (fL)    MCH 29.5  26.0 - 34.0 (pg)    MCHC 33.2  30.0 - 36.0 (g/dL)    RDW 13.0 (*) 86.5 - 15.5 (%)    Platelets 138 (*) 150 - 400 (K/uL)     Dg Chest 1 View  10/27/2011  *RADIOLOGY REPORT*  Clinical Data: Larey Seat on Monday with left hip pain  CHEST - 1 VIEW  Comparison: Chest x-ray of 07/17/2009  Findings: The lungs are clear.  The heart is mildly enlarged.  The bones are osteopenic.  Surgical clips overlie the left axilla.  IMPRESSION: No active lung disease.  Mild cardiomegaly.  Osteopenia.  Original Report Authenticated By: Juline Patch, M.D.     Dg Hip Complete Left  10/27/2011  *RADIOLOGY REPORT*  Clinical Data: Status post fall.  Pain.  LEFT HIP - COMPLETE 2+ VIEW  Comparison: Plain film the pelvis 08/11/2009.  Findings: The patient has an acute subcapital fracture of the left hip.  No other fracture is identified.  Femoral heads are located bilaterally.  IMPRESSION: Acute subcapital fracture left hip.  Per CMS PQRS reporting requirements (PQRS Measure 24): Given the patient's age of greater than 50 and the fracture site (hip, distal radius, or spine), the patient should be tested for osteoporosis using DXA, and the appropriate treatment considered based on the DXA results.  Original Report Authenticated By: Bernadene Bell. Maricela Curet, M.D.   Ct Head Wo Contrast  10/27/2011  *RADIOLOGY REPORT*  Clinical Data: .  Shaking.  Recent fall.  History of strokes. History of hypertension, breast cancer and dementia.  CT HEAD WITHOUT CONTRAST  Technique:  Contiguous axial images were obtained from the base of the skull through the vertex without contrast.  Comparison: 08/14/2010 and previous  Findings: The brain shows generalized atrophy.  There are extensive old small vessel infarctions throughout the hemispheric white matter.  No sign of acute infarction, mass lesion, hemorrhage, hydrocephalus or extra-axial collection.  No calvarial lesion. Small calvarial lucencies have not changed.  No skull fracture.  No fluid in the sinuses, middle ears or mastoids.  IMPRESSION: No acute or traumatic finding.  Atrophy and extensive chronic small vessel disease throughout the brain.  Original Report Authenticated By: Thomasenia Sales, M.D.    Review of Systems  Unable to perform ROS  Blood pressure 141/86, pulse 96, temperature 98.2 F (36.8 C), temperature source Oral, resp. rate 18, weight 56.1 kg (123 lb 10.9 oz), SpO2 94.00%. Physical Exam  Constitutional: Claudia [name] appears cachectic. Claudia [name] is easily aroused.  HENT:  Head: Atraumatic.  Eyes: EOM are normal.   Cardiovascular: Intact distal pulses.   Respiratory: Effort normal.  GI: Soft.  Musculoskeletal:       Left hip has mild tenderness to palpation. No significant swelling. Distally Claudia [name] has bandages on both ankles. Claudia [name] can wiggle her toes up and down. Capillary refill is about 2 seconds.  Neurological: Claudia [name] is alert and easily aroused. Claudia [name] is disoriented.  Skin: Skin is warm and dry.    Assessment/Plan: 78 year old [name] with multiple medical comorbidities with a valgus impacted left femoral neck fracture. Respiratory daughter at the bedside at length about her diagnosis prognosis and treatment options. I recommended surgery  to promote pain relief, early ambulation, and prevent complications of bed rest. I think based on her x-rays that Claudia [name] would likely do well with percutaneous screw fixation. There is slight displacement medially, however I think that given the fact that generally things are quite well lined up Claudia [name] will be better with a minimally invasive surgery with less blood loss, understanding that there may be some risk that things will displacement not heal and could need revision to hemiarthroplasty in the future. I had a long discussion with her daughter about these options and Claudia [name] agrees. Plan will be for left hip percutaneous screw fixation. I will allow her weightbearing as tolerated postoperatively. NPO, daughter will consent.  Mable Paris 10/28/2011, 8:02 AM

## 2011-10-28 NOTE — Progress Notes (Signed)
Subjective: Patient was seen earlier this morning.  Did not have any acute complaints.  Mentions that her pain was well controlled.  Objective: Filed Vitals:   10/28/11 1730 10/28/11 1745 10/28/11 1800 10/28/11 1837  BP: 184/90 180/103 184/96 178/83  Pulse: 109 103 100 93  Temp:    97.4 F (36.3 C)  TempSrc:    Oral  Resp: 17 18 17 18   Weight:      SpO2: 100% 100% 98% 100%   Weight change:   Intake/Output Summary (Last 24 hours) at 10/28/11 1949 Last data filed at 10/28/11 1841  Gross per 24 hour  Intake   1105 ml  Output    800 ml  Net    305 ml    General: Alert, awake, oriented x3, in no acute distress.  HEENT: No bruits, no goiter.  Heart: Regular rate and rhythm, without murmurs, rubs, gallops.  Lungs: Clear to auscultation Abdomen: Soft, nontender, nondistended, positive bowel sounds.  Neuro: moves all extremities, and answers questions appropriately.   Lab Results:  [facility] 10/28/11 0655 10/27/11 1511  NA 146* 144  K 3.1* 3.2*  CL 111 106  CO2 25 28  GLUCOSE 84 106*  BUN 25* 31*  CREATININE 1.31* 1.70*  CALCIUM 9.1 9.5  MG -- --  PHOS -- --    Basename 10/28/11 0655 10/27/11 1511  AST 48* 54*  ALT 20 22  ALKPHOS 60 63  BILITOT 0.6 0.6  PROT 5.8* 6.4  ALBUMIN 2.5* 3.0*   No results found for this basename: LIPASE:2,AMYLASE:2 in the last 72 hours  Basename 10/28/11 0655 10/27/11 1511  WBC 12.3* 13.8*  NEUTROABS -- 10.9*  HGB 13.7 13.9  HCT 41.3 41.1  MCV 89.0 87.8  PLT 138* 145*   No results found for this basename: CKTOTAL:3,CKMB:3,CKMBINDEX:3,TROPONINI:3 in the last 72 hours No components found with this basename: POCBNP:3 No results found for this basename: DDIMER:2 in the last 72 hours No results found for this basename: HGBA1C:2 in the last 72 hours No results found for this basename: CHOL:2,HDL:2,LDLCALC:2,TRIG:2,CHOLHDL:2,LDLDIRECT:2 in the last 72 hours  Basename 10/28/11 0655  TSH 8.578*  T4TOTAL --  T3FREE --  THYROIDAB --    No results found for this basename: VITAMINB12:2,FOLATE:2,FERRITIN:2,TIBC:2,IRON:2,RETICCTPCT:2 in the last 72 hours  Micro Results: No results found for this or any previous visit (from the past 240 hour(s)).  Studies/Results: Dg Chest 1 View  10/27/2011  *RADIOLOGY REPORT*  Clinical Data: Larey Seat on Monday with left hip pain  CHEST - 1 VIEW  Comparison: Chest x-ray of 07/17/2009  Findings: The lungs are clear.  The heart is mildly enlarged.  The bones are osteopenic.  Surgical clips overlie the left axilla.  IMPRESSION: No active lung disease.  Mild cardiomegaly.  Osteopenia.  Original Report Authenticated By: Juline Patch, M.D.   Dg Hip Complete Left  10/27/2011  *RADIOLOGY REPORT*  Clinical Data: Status post fall.  Pain.  LEFT HIP - COMPLETE 2+ VIEW  Comparison: Plain film the pelvis 08/11/2009.  Findings: The patient has an acute subcapital fracture of the left hip.  No other fracture is identified.  Femoral heads are located bilaterally.  IMPRESSION: Acute subcapital fracture left hip.  Per CMS PQRS reporting requirements (PQRS Measure 24): Given the patient's age of greater than 50 and the fracture site (hip, distal radius, or spine), the patient should be tested for osteoporosis using DXA, and the appropriate treatment considered based on the DXA results.  Original Report Authenticated By: Bernadene Bell. D'ALESSIO,  M.D.   Dg Hip Operative Left  10/28/2011  *RADIOLOGY REPORT*  Clinical Data: ORIF.  OPERATIVE LEFT HIP  Comparison: Pelvis films 10/27/2011.  Findings: Intraoperative fluoro spot images demonstrate three cannulated screws across the left femoral neck.  The screws into the femoral head, crossing the subcapital femoral neck fracture. Slight displacement persists.  Alignment is grossly anatomic.  IMPRESSION:  1.  Status post ORIF without radiographic evidence for complication.  Original Report Authenticated By: Jamesetta Orleans. MATTERN, M.D.   Ct Head Wo Contrast  10/27/2011  *RADIOLOGY  REPORT*  Clinical Data: .  Shaking.  Recent fall.  History of strokes. History of hypertension, breast cancer and dementia.  CT HEAD WITHOUT CONTRAST  Technique:  Contiguous axial images were obtained from the base of the skull through the vertex without contrast.  Comparison: 08/14/2010 and previous  Findings: The brain shows generalized atrophy.  There are extensive old small vessel infarctions throughout the hemispheric white matter.  No sign of acute infarction, mass lesion, hemorrhage, hydrocephalus or extra-axial collection.  No calvarial lesion. Small calvarial lucencies have not changed.  No skull fracture.  No fluid in the sinuses, middle ears or mastoids.  IMPRESSION: No acute or traumatic finding.  Atrophy and extensive chronic small vessel disease throughout the brain.  Original Report Authenticated By: Thomasenia Sales, M.D.    Medications: I have reviewed the patient's current medications.   Patient Active Hospital Problem List: Closed subcapital fracture of femur (10/28/2011) Per ortho will follow up with their recommendations.  Will plan on PT at ALF.  DEMENTIA (09/07/2006) Stable currently will continue current regimen  HYPERTENSION (09/07/2006) Has been elevated and may be due to pain.  At this point we will continue current regimen.  COPD (09/07/2006) Stable currently, no exacerbations.  Will continue current regimen.  RENAL INSUFFICIENCY (12/30/2008) Improved with IVF rehydration.  Likely prerenal.     LOS: 1 day   Penny Pia M.D.  [facility] 10/28/2011, 7:49 PM

## 2011-10-28 NOTE — Anesthesia Postprocedure Evaluation (Signed)
Anesthesia Post Note  Patient: Claudia [name]  Procedure(s) Performed: Procedure(s) (LRB): CANNULATED HIP PINNING (Left)  Anesthesia type: General  Patient location: PACU  Post pain: Pain level controlled and Adequate analgesia  Post assessment: Post-op Vital signs reviewed, Patient's Cardiovascular Status Stable, Respiratory Function Stable, Patent Airway and Pain level controlled  Last Vitals:  Filed Vitals:   10/28/11 1730  BP: 184/90  Pulse: 109  Temp:   Resp: 17    Post vital signs: Reviewed and stable  Level of consciousness: awake, alert  and oriented  Complications: No apparent anesthesia complications

## 2011-10-28 NOTE — Progress Notes (Signed)
Clinical social worker attempted to assess patient and complete psychosocial assessment. However pt was in procedure at this time. CSW to follow up tomorrow to complete psychosocial assessment.   Catha Gosselin, Theresia Majors  [phone] .10/28/2011 1656pm

## 2011-10-28 NOTE — Anesthesia Procedure Notes (Signed)
Procedure Name: Intubation Date/Time: 10/28/2011 3:53 PM Performed by: Quentin Ore Pre-anesthesia Checklist: Patient identified, Emergency Drugs available, Suction available, Patient being monitored and Timeout performed Patient Re-evaluated:Patient Re-evaluated prior to inductionOxygen Delivery Method: Circle system utilized Preoxygenation: Pre-oxygenation with 100% oxygen Intubation Type: IV induction Ventilation: Mask ventilation without difficulty Laryngoscope Size: Mac and 3 Grade View: Grade II Tube type: Oral Tube size: 7.5 mm Number of attempts: 1 Airway Equipment and Method: Stylet Placement Confirmation: ETT inserted through vocal cords under direct vision,  positive ETCO2 and breath sounds checked- equal and bilateral Secured at: 20 cm Tube secured with: Tape Dental Injury: Teeth and Oropharynx as per pre-operative assessment

## 2011-10-28 NOTE — Progress Notes (Signed)
Utilization Review Completed.Elinore Shults T3/21/2013   

## 2011-10-29 ENCOUNTER — Encounter (HOSPITAL_COMMUNITY): Payer: Self-pay | Admitting: Orthopedic Surgery

## 2011-10-29 ENCOUNTER — Inpatient Hospital Stay (HOSPITAL_COMMUNITY): Payer: Medicare Other

## 2011-10-29 LAB — CBC
HCT: 37.1 % (ref 36.0–46.0)
MCH: 29.1 pg (ref 26.0–34.0)
MCV: 90.7 fL (ref 78.0–100.0)
Platelets: 148 10*3/uL — ABNORMAL LOW (ref 150–400)
RDW: 16.1 % — ABNORMAL HIGH (ref 11.5–15.5)

## 2011-10-29 LAB — GLUCOSE, CAPILLARY: Glucose-Capillary: 105 mg/dL — ABNORMAL HIGH (ref 70–99)

## 2011-10-29 LAB — BASIC METABOLIC PANEL
BUN: 25 mg/dL — ABNORMAL HIGH (ref 6–23)
Calcium: 8.5 mg/dL (ref 8.4–10.5)
Chloride: 108 mEq/L (ref 96–112)
Creatinine, Ser: 1.54 mg/dL — ABNORMAL HIGH (ref 0.50–1.10)
GFR calc Af Amer: 34 mL/min — ABNORMAL LOW (ref >90)

## 2011-10-29 LAB — PROTIME-INR
INR: 1.21 (ref 0.00–1.49)
Prothrombin Time: 15.6 s — ABNORMAL HIGH (ref 11.6–15.2)

## 2011-10-29 NOTE — Progress Notes (Signed)
ANTICOAGULATION CONSULT NOTE - Follow Up Consult  Pharmacy Consult for coumadin Indication: VTE prophylaxis s/p hip pinning  Vital Signs: Temp: 99.5 F (37.5 C) (03/22 0539) Temp src: Oral (03/22 0539) BP: 116/70 mmHg (03/22 0539) Pulse Rate: 91  (03/22 0539)  Labs:  Basename 10/29/11 0532 10/28/11 1849 10/28/11 0655 10/27/11 1511  HGB 11.9* -- 13.7 --  HCT 37.1 -- 41.3 41.1  PLT 148* -- 138* 145*  APTT -- -- -- --  LABPROT 15.6* 14.8 -- --  INR 1.21 1.14 -- --  HEPARINUNFRC -- -- -- --  CREATININE 1.54* -- 1.31* 1.70*  CKTOTAL -- -- -- --  CKMB -- -- -- --  TROPONINI -- -- -- --   The CrCl is unknown because both a height and weight (above a minimum accepted value) are required for this calculation.  Assessment: 81 yof s/p fall and subsequate hip pinning started on coumadin for VTE prophylaxis. INR today remains subtherapeutic as expected after only 1 dose of coumadin. RN reported coughing up burgundy tinged sputum. Likely unrelated to coumadin since pt has only received 1 dose of coumadin. H/H is down slightly and pt is thrombocytopenic.  Goal of Therapy:  INR 1.5-2.5   Plan:  1. Continue coumadin 3mg  PO daily 2. F/u AM INR 3. Monitor for s/s of bleeding  Dayani Winbush, Drake Leach 10/29/2011,9:28 AM

## 2011-10-29 NOTE — Clinical Documentation Improvement (Signed)
CKD DOCUMENTATION CLARIFICATION QUERY   THIS DOCUMENT IS NOT A PERMANENT PART OF THE MEDICAL RECORD   Please update your documentation within the medical record to reflect your response to this query.                                                                                         10/29/11   Dr. Cena Benton and/or Associates,  In a better effort to capture your patient's severity of illness, reflect appropriate length of stay and utilization of resources, a review of the patient medical record has revealed the following indicators.    Based on your clinical judgment, please document the STAGE of the patient's documented CKD in the progress notes and discharge summary:   - CKD Stage I -  GFR > OR = 90  - CKD Stage II - GFR 60-80  - CKD Stage III - GFR 30-59  - CKD Stage IV - GFR 15-29  - CKD Stage V - GFR < 15  - ESRD (End Stage Renal Disease)  - Other condition  - Unable to Clinically Determine   Clinical Information:  "HPI: 78 year old female with advanced dementia, chronic kidney disease, hypothyroidism, hypertension, COPD had a fall on Monday at her assisted living facility" [facility]  10/28/2011, 1:28 AM  Bun/Cr/GFR   (wf) 25/1.7/29         01/15/2010 32/2.0/24         08/13/2010 22/1.82/26       08/14/2010 22/1.7/nd         07/13/2011 31/1.70/26       10/27/2011 25/1.31/35       10/28/2011 25/1.54/29       10/29/2011    In responding to this query please exercise your independent judgment.  The fact that a query is asked, does not imply that any particular answer is desired or expected.  Reviewed: additional documentation in the medical record  Thank You,  Jerral Ralph  RN BSN Certified Clinical Documentation Specialist: Cell   [phone]  Health Information Management [place]  TO RESPOND TO THE THIS QUERY, FOLLOW THE INSTRUCTIONS BELOW:  1. If needed, update documentation for the patient's encounter via the notes activity.  2. Access  this query again and click edit on the In [employer].  3. After updating, or not, click F2 to complete all highlighted (required) fields concerning your review. Select "additional documentation in the medical record" OR "no additional documentation provided".  4. Click Sign note button.  5. The deficiency will fall out of your In Basket *Please let us know if you are not able to complete this workflow by phone or e-mail (listed below).  Patient is a stage three CKD will document here and in note.  [employer]

## 2011-10-29 NOTE — Evaluation (Signed)
Physical Therapy Evaluation Patient Details Name: Claudia [name] MRN: [id number] DOB: 1923/07/23 Today's Date: 10/29/2011  Problem List:  Patient Active Problem List  Diagnoses  . HYPERTHYROIDISM  . HYPERLIPIDEMIA  . DEMENTIA  . DEPRESSION  . HYPERTENSION  . COPD  . RENAL INSUFFICIENCY  . SKIN LESION  . OSTEOARTHRITIS  . OSTEOPENIA  . GAIT DISTURBANCE  . BREAST CANCER, HX OF  . Cellulitis  . Contusion of left leg  . PVD (peripheral vascular disease)  . Closed subcapital fracture of femur    Past Medical History:  Past Medical History  Diagnosis Date  . COPD (chronic obstructive pulmonary disease)   . Hyperlipidemia   . Hypertension   . Dementia   . Renal insufficiency   . Breast cancer   . Hypothyroidism   . Osteopenia     last dexa aprox 2008- per daughter  . Osteoarthritis   . Depression   . Stroke     Multiple TIAs in the past   . Peripheral vascular disease     Diminished circulation in both legs   Past Surgical History:  Past Surgical History  Procedure Date  . Appendectomy   . Abdominal hysterectomy   . Tonsillectomy     PT Assessment/Plan/Recommendation PT Assessment Clinical Impression Statement: pt is an 78 y/o female adm after recent fall with subcapital neck fx s/p L hip pinning.  Pt had some difficulty mobilizing needing maximal assist for all tasks due to pain and weakness.  Pt can [facility] at [facility]. PT Recommendation/Assessment: Patient will need skilled PT in the acute care venue PT Problem List: Decreased strength;Decreased range of motion;Decreased activity tolerance;Decreased balance;Decreased mobility;Decreased knowledge of use of DME;Decreased safety awareness;Decreased knowledge of precautions;Cardiopulmonary status limiting activity;Pain PT Therapy Diagnosis : Difficulty walking;Generalized weakness;Acute pain PT Plan PT Frequency: Min 3X/week PT Treatment/Interventions: DME instruction;Gait training;Functional mobility  training;Therapeutic activities;Therapeutic exercise;Balance training;Patient/family education PT Recommendation Follow Up Recommendations: Skilled nursing facility Equipment Recommended: Defer to next venue PT Goals  Acute Rehab PT Goals PT Goal Formulation: With patient Time For Goal Achievement: 7 days Pt will go Supine/Side to Sit: with min assist;with HOB 0 degrees PT Goal: Supine/Side to Sit - Progress: Goal set today Pt will go Sit to Stand: with min assist PT Goal: Sit to Stand - Progress: Goal set today Pt will Transfer Bed to Chair/Chair to Bed: with mod assist PT Transfer Goal: Bed to Chair/Chair to Bed - Progress: Goal set today Pt will Ambulate: 16 - 50 feet;with mod assist;with least restrictive assistive device PT Goal: Ambulate - Progress: Goal set today  PT Evaluation Precautions/Restrictions  Precautions Precautions: Fall Required Braces or Orthoses: No Restrictions Weight Bearing Restrictions: Yes LLE Weight Bearing: Weight bearing as tolerated Prior Functioning  Home Living Receives Help From: Personal care attendant Type of Home: Assisted living Home Layout: One level;Able to live on main level with bedroom/bathroom Home Access: Level entry Bathroom Shower/Tub: Walk-in shower;Curtain Bathroom Toilet: Standard Home Adaptive Equipment: Grab bars around toilet;Grab bars in shower;Walker - rolling Prior Function Level of Independence: Independent with gait;Independent with transfers;Requires assistive device for independence Cognition Cognition Arousal/Alertness: Awake/alert Overall Cognitive Status: History of cognitive impairments History of Cognitive Impairment: Appears at baseline functioning Orientation Level: Oriented to person;Oriented to place Sensation/Coordination Sensation Light Touch: Appears Intact Coordination Gross Motor Movements are Fluid and Coordinated: Yes Extremity Assessment RUE Assessment RUE Assessment: Not tested LUE  Assessment LUE Assessment: Not tested RLE Assessment RLE Assessment: Exceptions to [facility] RLE Strength RLE Overall Strength:  Due to pain RLE Overall Strength Comments: grossly 3+/5 LLE Assessment LLE Assessment: Exceptions to [facility] LLE Strength LLE Overall Strength: Due to pain LLE Overall Strength Comments: grossly at 3-/5 Mobility (including Balance) Bed Mobility Bed Mobility: Yes Supine to Sit: 2: Max assist;HOB flat Supine to Sit Details (indicate cue type and reason): vc/tc's for safe technique and hand placement; truncal assist to come up to sit. Sitting - Scoot to Edge of Bed: 2: Max assist Sitting - Scoot to [employer] of Bed Details (indicate cue type and reason): vc/tc's for w/shift help and truncal assist to complete asymetric scoot. Transfers Transfers: Yes Sit to Stand: 2: Max assist;From bed;With upper extremity assist;From chair/3-in-1;Other (comment) (x 3) Sit to Stand Details (indicate cue type and reason): vc/tc's for technique and hand placement; lifting and stability assist to get up and forward over her BOS Stand to Sit: 2: Max assist;With upper extremity assist;To chair/3-in-1 Stand to Sit Details: pt just hung out and would not reach her hands back or squat Squat Pivot Transfers: 2: Max assist;Patient percentage (comment);From elevated surface;With upper extremity assistance;Other (comment) (pt >=25%) Squat Pivot Transfer Details (indicate cue type and reason): vc/tc's for technique, but with little pt assist Ambulation/Gait Ambulation/Gait: No Stairs: No Wheelchair Mobility Wheelchair Mobility: No  Posture/Postural Control Posture/Postural Control: No significant limitations Balance Balance Assessed: Yes Static Sitting Balance Static Sitting - Balance Support: Right upper extremity supported;Left upper extremity supported;Feet supported Static Sitting - Level of Assistance: 4: Min assist Static Sitting - Comment/# of Minutes: 7 (tends to drift right) Exercise    Total Joint Exercises Ankle Circles/Pumps: Both;20 reps;AROM;Seated Quad Sets: AROM;Both;10 reps;Supine Heel Slides: AAROM;Both;10 reps;Supine Hip ABduction/ADduction: AAROM;Both;15 reps;Supine Straight Leg Raises: AAROM;Right;10 reps;Supine Long Arc Quad: AROM;AAROM;Both;10 reps;Seated End of Session PT - End of Session Activity Tolerance: Patient limited by pain Patient left: in chair;with call bell in reach;with family/visitor present Nurse Communication: Mobility status for transfers General Behavior During Session: [facility] for tasks performed Cognition: Impaired, at baseline  Beckey Polkowski, Eliseo Gum 10/29/2011, 10:54 AM  10/29/2011  Ocean Bing, PT [phone] (pager)

## 2011-10-29 NOTE — Progress Notes (Signed)
Orthopedic Tech Progress Note Patient Details:  Claudia [name] 10-19-1922 [id number]  Patient ID: Claudia [name], female   DOB: February 14, 1923, 78 y.o.   MRN: [id number] Patient does not have proper bed for [facility].   Leo Grosser T 10/29/2011, 5:20 PM

## 2011-10-29 NOTE — Progress Notes (Signed)
Orthopedic Tech Progress Note Patient Details:  Claudia [name] 1923-06-05 [id number]  Type of Splint: Lenora Boys Splint Location: bilateral LE Splint Interventions: Application Per physicians order, wrapped patient in Kerlix and coban.   Leo Grosser T 10/29/2011, 5:18 PM

## 2011-10-29 NOTE — Progress Notes (Signed)
Subjective: Reportedly patient had red jello and I was called by the nurse who indicated that the patient coughed up blood.  I was not aware that patient had eaten red jello prior so I ordered portable chest x ray which did not show any acute disease.  Patient was not coughing during our interation and denied any chest discomfort.  No acute issues reported overnight.  Objective: Filed Vitals:   10/29/11 1034 10/29/11 1200 10/29/11 1418 10/29/11 1600  BP:   98/50   Pulse:   93   Temp:   96.9 F (36.1 C)   TempSrc:   Oral   Resp:  18 16 16   Weight:      SpO2: 96% 98% 99% 98%   Weight change: 0.146 kg (5.2 oz)  Intake/Output Summary (Last 24 hours) at 10/29/11 1818 Last data filed at 10/29/11 1133  Gross per 24 hour  Intake 1311.67 ml  Output    101 ml  Net 1210.67 ml    General: Alert, awake, oriented x3, in no acute distress.  HEENT: No bruits, no goiter.  Heart: Regular rate and rhythm, without murmurs, rubs, gallops.  Lungs: Clear to auscultation, no wheezes Abdomen: Soft, nontender, nondistended, positive bowel sounds.  Neuro: Grossly intact, nonfocal.   Lab Results:  Basename 10/29/11 0532 10/28/11 0655  NA 144 146*  K 3.4* 3.1*  CL 108 111  CO2 25 25  GLUCOSE 82 84  BUN 25* 25*  CREATININE 1.54* 1.31*  CALCIUM 8.5 9.1  MG -- --  PHOS -- --    Basename 10/28/11 0655 10/27/11 1511  AST 48* 54*  ALT 20 22  ALKPHOS 60 63  BILITOT 0.6 0.6  PROT 5.8* 6.4  ALBUMIN 2.5* 3.0*   No results found for this basename: LIPASE:2,AMYLASE:2 in the last 72 hours  Basename 10/29/11 0532 10/28/11 0655 10/27/11 1511  WBC 11.8* 12.3* --  NEUTROABS -- -- 10.9*  HGB 11.9* 13.7 --  HCT 37.1 41.3 --  MCV 90.7 89.0 --  PLT 148* 138* --   No results found for this basename: CKTOTAL:3,CKMB:3,CKMBINDEX:3,TROPONINI:3 in the last 72 hours No components found with this basename: POCBNP:3 No results found for this basename: DDIMER:2 in the last 72 hours No results found for this  basename: HGBA1C:2 in the last 72 hours No results found for this basename: CHOL:2,HDL:2,LDLCALC:2,TRIG:2,CHOLHDL:2,LDLDIRECT:2 in the last 72 hours  Basename 10/28/11 0655  TSH 8.578*  T4TOTAL --  T3FREE --  THYROIDAB --   No results found for this basename: VITAMINB12:2,FOLATE:2,FERRITIN:2,TIBC:2,IRON:2,RETICCTPCT:2 in the last 72 hours  Micro Results: Recent Results (from the past 240 hour(s))  URINE CULTURE     Status: Normal (Preliminary result)   Collection Time   10/27/11  3:42 PM      Component Value Range Status Comment   Specimen Description URINE, CATHETERIZED   Final    Special Requests NONE   Final    Culture  Setup Time [id number]   Final    Colony Count >=100,000 COLONIES/ML   Final    Culture ENTEROCOCCUS SPECIES   Final    Report Status PENDING   Incomplete     Studies/Results: Dg Hip Operative Left  10/28/2011  *RADIOLOGY REPORT*  Clinical Data: ORIF.  OPERATIVE LEFT HIP  Comparison: Pelvis films 10/27/2011.  Findings: Intraoperative fluoro spot images demonstrate three cannulated screws across the left femoral neck.  The screws into the femoral head, crossing the subcapital femoral neck fracture. Slight displacement persists.  Alignment is grossly anatomic.  IMPRESSION:  1.  Status post ORIF without radiographic evidence for complication.  Original Report Authenticated By: Jamesetta Orleans. MATTERN, M.D.   Dg Chest Port 1 View  10/29/2011  *RADIOLOGY REPORT*  Clinical Data: Status post hip surgery 1 day ago.  Vomiting.  PORTABLE CHEST - 1 VIEW  Comparison: Plain film chest 10/27/2011.  Findings: Lungs are clear.  Heart size is normal.  No pneumothorax or effusion.  Clips in the left axilla noted.  IMPRESSION: No acute disease.  Original Report Authenticated By: Bernadene Bell. D'ALESSIO, M.D.   Dg Hip Portable 1 View Left  10/28/2011  *RADIOLOGY REPORT*  Clinical Data: Status post surgery  PORTABLE LEFT HIP - 1 VIEW  Comparison: 10/28/2011  Findings: Portable radiograph of  the left hip demonstrates three cannulated screws across the left femoral neck.  The screws extend into the femoral head reducing the subcapital femoral neck fracture.  Slight displacement persists.  IMPRESSION:  1.  Status post ORIF of left hip fracture.  Original Report Authenticated By: Rosealee Albee, M.D.    Medications: I have reviewed the patient's current medications.  Patient Active Hospital Problem List: Closed subcapital fracture of femur (10/28/2011) Per ortho will follow up with their recommendations. Will plan on PT at ALF.  Social worker reports that patient will be able to be discharged to SNF once bed is available.   DEMENTIA (09/07/2006) Stable currently will continue current regimen   HYPERTENSION (09/07/2006) Will continue to monitor and encourage po intake. Patient's last blood pressure reading as 98/50 there is currently non active bleeding and suspect that patient's low blood pressure may be secondary to poor po intake.  Will continue to reassess.   COPD (09/07/2006) Stable currently, no exacerbations. Will continue current regimen.   RENAL INSUFFICIENCY (12/30/2008) Improved with IVF rehydration. Likely prerenal.      LOS: 2 days   Penny Pia M.D.  [facility] 10/29/2011, 6:18 PM

## 2011-10-29 NOTE — Progress Notes (Signed)
Clinical social worker spoke with pt current assisted living facility, [facility] regarding physical therapy needs and pt disposition needs.   [facility] nurse, came to assess patient in room to evaluate patient needs and accomodations for pt at facility.  P[facility]es confirmed that patient needs can be met at assisted living.  If pt and pt family are interested in short term rehab at snf, P[facility] Montes confirmed that pt will be able to hold bed at alf while completing short term rehab.   Pt daughter expected to visit early this afternoon.   CSW and pt agreed to discuss once patient daughter was present.   CSW to follow up with assisted living this afternoon regarding pt discharge plans.    Catha Gosselin, Theresia Majors  [phone] .10/29/2011 11:55am

## 2011-10-29 NOTE — Progress Notes (Signed)
Orthopedic Tech Progress Note Patient Details:  Claudia [name] 04-08-1923 [id number] Trapeze bar patient helper     Nikki Dom 10/29/2011, 7:43 PM

## 2011-10-29 NOTE — Progress Notes (Signed)
Clinical social worker completed psychosocial assessment and initiated skilled nursing facility search. Please see assessment and not below.   Clinical Social Work Department BRIEF PSYCHOSOCIAL ASSESSMENT 10/29/2011  Patient:  Claudia [name],Claudia [name]     Account Number:  [id number]     Admit date:  10/27/2011  Clinical Social Worker:  Doree Albee  Date/Time:  10/29/2011 01:00 PM  Referred by:  RN  Date Referred:  10/29/2011 Referred for  SNF Placement   Other Referral:   Pt admitted from [facility] assisted living   Interview type:  Patient Other interview type:   Patient daughter and Pt family friend at pt beside    PSYCHOSOCIAL DATA Living Status:  FACILITY Admitted from facility:  [facility] Level of care:  Assisted Living Primary support name:  Claudia [name] Primary support relationship to patient:  CHILD, ADULT Degree of support available:   strong    CURRENT CONCERNS Current Concerns  Post-Acute Placement   Other Concerns:   pt can return to alf for and recieve rehab, however pt dtr has concerns regarding intensity of therapy available at alf versus snf.    SOCIAL WORK ASSESSMENT / PLAN CSW met with pt ant pt dtr regarding str at snf versus returning to alf. pt dtr and pt agreed that rehab at snf with more intense therapy everyday was more approrpriate than returning to alf with less therapy and assistance available.   Assessment/plan status:  Psychosocial Support/Ongoing Assessment of Needs Other assessment/ plan:   Discharge planning   Information/referral to community resources:   snf list    PATIENT'S/FAMILY'S RESPONSE TO PLAN OF CARE: Pt and Pt dtr very appreciative of csw concern and support. pt is motivated to dc to rehab at snf and returning to [facility] after completing rehab.     Clinical Social Work Department CLINICAL SOCIAL WORK PLACEMENT NOTE 10/29/2011  Patient:  Claudia [name],Claudia [name]  Account Number:  [id number] Admit  date:  10/27/2011  Clinical Social Worker:  Doree Albee  Date/time:  10/29/2011 03:00 PM  Clinical Social Work is seeking post-discharge placement for this patient at the following level of care:   SKILLED NURSING   (*CSW will update this form in Epic as items are completed)   10/29/2011  Patient/family provided with [facility] Department of Clinical Social Work's list of facilities offering this level of care within the geographic area requested by the patient (or if unable, by the patient's family).  10/29/2011  Patient/family informed of their freedom to choose among providers that offer the needed level of care, that participate in Medicare, Medicaid or managed care program needed by the patient, have an available bed and are willing to accept the patient.  10/29/2011  Patient/family informed of [facility]' ownership interest in [facility], as well as of the fact that they are under no obligation to receive care at this facility.  PASARR submitted to EDS on 10/29/2011 PASARR number received from EDS on 10/29/2011  FL2 transmitted to all facilities in geographic area requested by pt/family on  10/29/2011 FL2 transmitted to all facilities within larger geographic area on   Patient informed that his/her managed care company has contracts with or will negotiate with  certain facilities, including the following:     Patient/family informed of bed offers received:   Patient chooses bed at  Physician recommends and patient chooses bed at    Patient to be transferred to  on   Patient to be transferred to facility by  The following physician request were entered in Epic:   Additional Comments:  .Clinical social worker continuing to follow pt to assist with pt dc plans and further csw needs.   Catha Gosselin, Theresia Majors  [phone] .10/29/2011 15:10pm

## 2011-10-29 NOTE — Progress Notes (Signed)
PATIENT ID: Claudia [name]  MRN: [id number]  DOB/AGE:  09-21-1922 / 78 y.o.  1 Day Post-Op Procedure(s) (LRB): CANNULATED HIP PINNING (Left)  Subjective: Pain is mild.  No c/o chest pain or SOB.   She is comfortable, no new c/o.   Objective: Vital signs in last 24 hours: Temp:  [95.9 F (35.5 C)-99.5 F (37.5 C)] 99.5 F (37.5 C) (03/22 0539) Pulse Rate:  [88-117] 91  (03/22 0539) Resp:  [16-21] 16  (03/22 0539) BP: (116-185)/(48-103) 116/70 mmHg (03/22 0539) SpO2:  [98 %-100 %] 99 % (03/22 0539) Weight:  [56.246 kg (124 lb)] 56.246 kg (124 lb) (03/22 0539)  Intake/Output from previous day: 03/21 0701 - 03/22 0700 In: 830 [P.O.:30; I.V.:800] Out: 450 [Urine:450] Intake/Output this shift:     Basename 10/29/11 0532 10/28/11 0655 10/27/11 1511  HGB 11.9* 13.7 13.9    Basename 10/29/11 0532 10/28/11 0655  WBC 11.8* 12.3*  RBC 4.09 4.64  HCT 37.1 41.3  PLT 148* 138*    Basename 10/29/11 0532 10/28/11 0655  NA 144 146*  K 3.4* 3.1*  CL 108 111  CO2 25 25  BUN 25* 25*  CREATININE 1.54* 1.31*  GLUCOSE 82 84  CALCIUM 8.5 9.1    Basename 10/29/11 0532 10/28/11 1849  LABPT -- --  INR 1.21 1.14    Physical Exam: Sensation intact distally Dorsiflexion/Plantar flexion intact Incision: dressing C/D/I Compartment soft  Assessment/Plan: 1 Day Post-Op Procedure(s) (LRB): CANNULATED HIP PINNING (Left)   Advance diet Up with therapy Weight Bearing as Tolerated (WBAT) with walker  VTE prophylaxis: pharmacologic prophylaxis (with any of the following: warfarin adjusted-dose) x 30 days. Goal INR 1.5-2.5 Likely d/c to SNF when medically stable.   Mable Paris 10/29/2011, 7:48 AM

## 2011-10-29 NOTE — Progress Notes (Signed)
Pt appears to have coughed up blood, burgundy in color mixed with sputum.  Lung sounds diminished. No further complaints.  Will notify MD and will carry out new orders.

## 2011-10-29 NOTE — Progress Notes (Signed)
CSW provided bed offers to patient family. Please see updated placement note below. Pt plans to dc to [facility] on Monday or when medically stable.   Clinical Social Work Department CLINICAL SOCIAL WORK PLACEMENT NOTE 10/29/2011  Patient:  Faircloth,Ebony  Account Number:  [id number] Admit date:  10/27/2011  Clinical Social Worker:  Doree Albee  Date/time:  10/29/2011 03:00 PM  Clinical Social Work is seeking post-discharge placement for this patient at the following level of care:   SKILLED NURSING   (*CSW will update this form in Epic as items are completed)   10/29/2011  Patient/family provided with [facility] Department of Clinical Social Work's list of facilities offering this level of care within the geographic area requested by the patient (or if unable, by the patient's family).  10/29/2011  Patient/family informed of their freedom to choose among providers that offer the needed level of care, that participate in Medicare, Medicaid or managed care program needed by the patient, have an available bed and are willing to accept the patient.  10/29/2011  Patient/family informed of [facility]' ownership interest in [facility], as well as of the fact that they are under no obligation to receive care at this facility.  PASARR submitted to EDS on 10/29/2011 PASARR number received from EDS on 10/29/2011  FL2 transmitted to all facilities in geographic area requested by pt/family on  10/29/2011 FL2 transmitted to all facilities within larger geographic area on   Patient informed that his/her managed care company has contracts with or will negotiate with  certain facilities, including the following:     Patient/family informed of bed offers received:  10/29/2011 Patient chooses bed at [facility] Physician recommends and patient chooses bed at    Patient to be transferred to  on   Patient to be transferred to facility by   The following physician request  were entered in Epic:   Additional Comments:  .Clinical social worker continuing to follow pt to assist with pt dc plans and further csw needs.   Catha Gosselin, Theresia Majors  [phone] .10/29/2011 1629pm

## 2011-10-30 LAB — BASIC METABOLIC PANEL
BUN: 22 mg/dL (ref 6–23)
Calcium: 7.7 mg/dL — ABNORMAL LOW (ref 8.4–10.5)
GFR calc Af Amer: 38 mL/min — ABNORMAL LOW (ref >90)
GFR calc non Af Amer: 33 mL/min — ABNORMAL LOW (ref >90)
Glucose, Bld: 89 mg/dL (ref 70–99)

## 2011-10-30 LAB — CBC
HCT: 33.1 % — ABNORMAL LOW (ref 36.0–46.0)
Hemoglobin: 10.7 g/dL — ABNORMAL LOW (ref 12.0–15.0)
MCH: 28.9 pg (ref 26.0–34.0)
MCHC: 32.3 g/dL (ref 30.0–36.0)
MCV: 89.5 fL (ref 78.0–100.0)
Platelets: 137 10*3/uL — ABNORMAL LOW (ref 150–400)
RBC: 3.7 MIL/uL — ABNORMAL LOW (ref 3.87–5.11)
RDW: 15.8 % — ABNORMAL HIGH (ref 11.5–15.5)
WBC: 11.3 10*3/uL — ABNORMAL HIGH (ref 4.0–10.5)

## 2011-10-30 LAB — PROTIME-INR
INR: 1.46 (ref 0.00–1.49)
Prothrombin Time: 18 seconds — ABNORMAL HIGH (ref 11.6–15.2)

## 2011-10-30 LAB — URINE CULTURE: Culture  Setup Time: 201303210013

## 2011-10-30 NOTE — Progress Notes (Signed)
PATIENT ID: Claudia [name]  MRN: [id number]  DOB/AGE:  [birth year] / 76 y.o.  2 Days Post-Op Procedure(s) (LRB): CANNULATED HIP PINNING (Left)  Subjective: Pain is mild.  No c/o chest pain or SOB.   Resting comfortably.   Objective: Vital signs in last 24 hours: Temp:  [96.9 F (36.1 C)-98.1 F (36.7 C)] 98.1 F (36.7 C) (03/23 0534) Pulse Rate:  [91-103] 91  (03/23 0534) Resp:  [16-20] 18  (03/23 0534) BP: (98-138)/(50-75) 138/75 mmHg (03/23 0534) SpO2:  [96 %-100 %] 100 % (03/23 0534) Weight:  [59.2 kg (130 lb 8.2 oz)] 59.2 kg (130 lb 8.2 oz) (03/23 0534)  Intake/Output from previous day: 03/22 0701 - 03/23 0700 In: 3214.2 [P.O.:700; I.V.:2514.2] Out: 676 [Urine:675; Stool:1] Intake/Output this shift:     Basename 10/30/11 0640 10/29/11 0532 10/28/11 0655 10/27/11 1511  HGB 10.7* 11.9* 13.7 13.9    Basename 10/30/11 0640 10/29/11 0532  WBC 11.3* 11.8*  RBC 3.70* 4.09  HCT 33.1* 37.1  PLT 137* 148*    Basename 10/30/11 0640 10/29/11 0532  NA 137 144  K 3.3* 3.4*  CL 107 108  CO2 22 25  BUN 22 25*  CREATININE 1.39* 1.54*  GLUCOSE 89 82  CALCIUM 7.7* 8.5    Basename 10/30/11 0640 10/29/11 0532  LABPT -- --  INR 1.46 1.21    Physical Exam: Intact pulses distally Incision: dressing C/D/I Compartment soft  Assessment/Plan: 2 Days Post-Op Procedure(s) (LRB): CANNULATED HIP PINNING (Left)   Up with therapy Weight Bearing as Tolerated (WBAT)  VTE prophylaxis: pharmacologic prophylaxis (with any of the following: warfarin adjusted-dose) Ok for d/c to SNF when bed available from ortho standpoint. F/u in my office 10-14 days post op [id number])   Mable Paris 10/30/2011, 9:09 AM

## 2011-10-30 NOTE — Progress Notes (Signed)
Subjective: Pt has no complaints today.  Objective: Filed Vitals:   10/30/11 0800 10/30/11 1200 10/30/11 1444 10/30/11 1530  BP:   152/53   Pulse:   86   Temp:   97.3 F (36.3 C)   TempSrc:   Oral   Resp: 18 18 18 18   Height:      Weight:      SpO2: 94% 94% 100% 94%   Weight change: 2.954 kg (6 lb 8.2 oz)  Intake/Output Summary (Last 24 hours) at 10/30/11 1939 Last data filed at 10/30/11 1902  Gross per 24 hour  Intake   2455 ml  Output   1450 ml  Net   1005 ml    General: Alert, awake, oriented x3, in no acute distress.  HEENT: No bruits, no goiter.  Heart: Regular rate and rhythm, without murmurs, rubs, gallops.  Lungs: Clear to auscultation, no wheezes  Abdomen: Soft, nontender, nondistended, positive bowel sounds.  Neuro: Grossly intact, nonfocal.  Lab Results:  [facility] 10/30/11 0640 10/29/11 0532  NA 137 144  K 3.3* 3.4*  CL 107 108  CO2 22 25  GLUCOSE 89 82  BUN 22 25*  CREATININE 1.39* 1.54*  CALCIUM 7.7* 8.5  MG -- --  PHOS -- --    Basename 10/28/11 0655  AST 48*  ALT 20  ALKPHOS 60  BILITOT 0.6  PROT 5.8*  ALBUMIN 2.5*   No results found for this basename: LIPASE:2,AMYLASE:2 in the last 72 hours  Basename 10/30/11 0640 10/29/11 0532  WBC 11.3* 11.8*  NEUTROABS -- --  HGB 10.7* 11.9*  HCT 33.1* 37.1  MCV 89.5 90.7  PLT 137* 148*   No results found for this basename: CKTOTAL:3,CKMB:3,CKMBINDEX:3,TROPONINI:3 in the last 72 hours No components found with this basename: POCBNP:3 No results found for this basename: DDIMER:2 in the last 72 hours No results found for this basename: HGBA1C:2 in the last 72 hours No results found for this basename: CHOL:2,HDL:2,LDLCALC:2,TRIG:2,CHOLHDL:2,LDLDIRECT:2 in the last 72 hours  Basename 10/28/11 0655  TSH 8.578*  T4TOTAL --  T3FREE --  THYROIDAB --   No results found for this basename: VITAMINB12:2,FOLATE:2,FERRITIN:2,TIBC:2,IRON:2,RETICCTPCT:2 in the last 72 hours  Micro Results: Recent  Results (from the past 240 hour(s))  URINE CULTURE     Status: Normal   Collection Time   10/27/11  3:42 PM      Component Value Range Status Comment   Specimen Description URINE, CATHETERIZED   Final    Special Requests NONE   Final    Culture  Setup Time [id number]   Final    Colony Count >=100,000 COLONIES/ML   Final    Culture ENTEROCOCCUS SPECIES   Final    Report Status 10/30/2011 FINAL   Final    Organism ID, Bacteria ENTEROCOCCUS SPECIES   Final   MRSA PCR SCREENING     Status: Normal   Collection Time   10/30/11  3:40 AM      Component Value Range Status Comment   MRSA by PCR NEGATIVE  NEGATIVE  Final     Studies/Results: Dg Chest Port 1 View  10/29/2011  *RADIOLOGY REPORT*  Clinical Data: Status post hip surgery 1 day ago.  Vomiting.  PORTABLE CHEST - 1 VIEW  Comparison: Plain film chest 10/27/2011.  Findings: Lungs are clear.  Heart size is normal.  No pneumothorax or effusion.  Clips in the left axilla noted.  IMPRESSION: No acute disease.  Original Report Authenticated By: Bernadene Bell. D'ALESSIO, M.D.   Dg Hip  Portable 1 View Left  10/28/2011  *RADIOLOGY REPORT*  Clinical Data: Status post surgery  PORTABLE LEFT HIP - 1 VIEW  Comparison: 10/28/2011  Findings: Portable radiograph of the left hip demonstrates three cannulated screws across the left femoral neck.  The screws extend into the femoral head reducing the subcapital femoral neck fracture.  Slight displacement persists.  IMPRESSION:  1.  Status post ORIF of left hip fracture.  Original Report Authenticated By: Rosealee Albee, M.D.    Medications: I have reviewed the patient's current medications.  Patient Active Hospital Problem List: Closed subcapital fracture of femur (10/28/2011)  Will plan on PT at ALF. Social worker reports that patient will be able to be discharged to SNF once bed is available.  Planning for monday  DEMENTIA (09/07/2006) Stable currently will continue current regimen   HYPERTENSION  (09/07/2006) Will continue to monitor and encourage po intake. Patient's last blood pressure reading as 98/50 there is currently non active bleeding and suspect that patient's low blood pressure may be secondary to poor po intake. Will continue to reassess.   COPD (09/07/2006) Stable currently, no exacerbations. Will continue current regimen.   RENAL INSUFFICIENCY (12/30/2008) Improved with IVF rehydration. Likely prerenal.     LOS: 3 days   Penny Pia M.D.  [facility] 10/30/2011, 7:39 PM

## 2011-10-30 NOTE — Evaluation (Signed)
Occupational Therapy Evaluation Patient Details Name: Claudia [name] MRN: [id number] DOB: 01/26/1923 Today's Date: 10/30/2011  Problem List:  Patient Active Problem List  Diagnoses  . HYPERTHYROIDISM  . HYPERLIPIDEMIA  . DEMENTIA  . DEPRESSION  . HYPERTENSION  . COPD  . RENAL INSUFFICIENCY  . SKIN LESION  . OSTEOARTHRITIS  . OSTEOPENIA  . GAIT DISTURBANCE  . BREAST CANCER, HX OF  . Cellulitis  . Contusion of left leg  . PVD (peripheral vascular disease)  . Closed subcapital fracture of femur    Past Medical History:  Past Medical History  Diagnosis Date  . COPD (chronic obstructive pulmonary disease)   . Hyperlipidemia   . Hypertension   . Dementia   . Renal insufficiency   . Breast cancer   . Hypothyroidism   . Osteopenia     last dexa aprox 2008- per daughter  . Osteoarthritis   . Depression   . Stroke     Multiple TIAs in the past   . Peripheral vascular disease     Diminished circulation in both legs   Past Surgical History:  Past Surgical History  Procedure Date  . Appendectomy   . Abdominal hysterectomy   . Tonsillectomy   . Hip pinning,cannulated 10/28/2011    Procedure: CANNULATED HIP PINNING;  Surgeon: Mable Paris, MD;  Location: [facility] OR;  Service: Orthopedics;  Laterality: Left;    OT Assessment/Plan/Recommendation OT Assessment Clinical Impression Statement: pt is an 78 y/o female adm after recent fall with subcapital neck fx s/p L hip pinning.  Requiring total assist for mobility and LB ADL.  Recommend SNF upon d/c.  Will follow acutely.  OT Recommendation/Assessment: Patient will need skilled OT in the acute care venue OT Problem List: Decreased strength;Impaired balance (sitting and/or standing);Decreased cognition;Decreased knowledge of use of DME or AE;Pain OT Therapy Diagnosis : Generalized weakness;Cognitive deficits;Acute pain OT Plan OT Frequency: Min 1X/week OT Treatment/Interventions: Self-care/ADL training;DME and/or AE  instruction;Patient/family education;Therapeutic activities OT Recommendation Follow Up Recommendations: Skilled nursing facility Equipment Recommended: Defer to next venue Individuals Consulted Consulted and Agree with Results and Recommendations: Patient OT Goals Acute Rehab OT Goals OT Goal Formulation: With patient Time For Goal Achievement: 2 weeks ADL Goals Pt Will Perform Lower Body Bathing: with mod assist;Sitting, edge of bed;Sit to stand from bed;with adaptive equipment ADL Goal: Lower Body Bathing - Progress: Goal set today Pt Will Perform Lower Body Dressing: with min assist;with adaptive equipment;Sit to stand from bed;Sitting, bed ADL Goal: Lower Body Dressing - Progress: Goal set today Pt Will Transfer to Toilet: with mod assist;3-in-1;Stand pivot transfer ADL Goal: Toilet Transfer - Progress: Goal set today Pt Will Perform Toileting - Clothing Manipulation: with min assist;Standing ADL Goal: Toileting - Clothing Manipulation - Progress: Goal set today Pt Will Perform Toileting - Hygiene: with supervision;Sitting on 3-in-1 or toilet ADL Goal: Toileting - Hygiene - Progress: Goal set today Miscellaneous OT Goals Miscellaneous OT Goal #1: Pt will perform supine to sit at EOB with min assist in prep or ADL. OT Goal: Miscellaneous Goal #1 - Progress: Goal set today  OT Evaluation Precautions/Restrictions  Precautions Precautions: Fall Required Braces or Orthoses: No Restrictions Weight Bearing Restrictions: Yes LLE Weight Bearing: Weight bearing as tolerated Prior Functioning Home Living Receives Help From: Personal care attendant Type of Home: Assisted living Home Layout: One level;Able to live on main level with bedroom/bathroom Home Access: Level entry Bathroom Shower/Tub: Walk-in shower;Curtain Bathroom Toilet: Standard Home Adaptive Equipment: Grab bars around toilet;Grab bars in shower;Walker -  rolling Prior Function Level of Independence: Independent with  gait;Independent with transfers;Requires assistive device for independence Driving: No Vocation: Retired Leisure: Hobbies-yes (Comment) Comments: Reading ADL ADL Eating/Feeding: Performed;Set up Where Assessed - Eating/Feeding: Chair Grooming: Performed;Wash/dry face;Wash/dry hands;Brushing hair;Set up Where Assessed - Grooming: Sitting, chair Upper Body Bathing: Performed;Minimal assistance Where Assessed - Upper Body Bathing: Sitting, chair Lower Body Bathing: Performed;+1 Total assistance Where Assessed - Lower Body Bathing: Sitting, chair;Sit to stand from chair Upper Body Dressing: Performed;Minimal assistance Where Assessed - Upper Body Dressing: Sitting, chair Lower Body Dressing: Simulated;+1 Total assistance Where Assessed - Lower Body Dressing: Sitting, chair;Sit to stand from chair Toilet Transfer: Performed;+2 Total assistance;Comment for patient % (50) Toilet Transfer Method: Stand pivot Acupuncturist: Set designer - Hygiene: Performed;+1 Total assistance Where Assessed - Toileting Hygiene: Sit to stand from 3-in-1 or toilet Vision/Perception  Vision - History Baseline Vision: Wears glasses only for reading Patient Visual Report: No change from baseline Cognition Cognition Arousal/Alertness: Awake/alert Overall Cognitive Status: History of cognitive impairments History of Cognitive Impairment: Appears at baseline functioning Orientation Level: Oriented to person;Disoriented to place;Disoriented to time Sensation/Coordination Coordination Fine Motor Movements are Fluid and Coordinated: Yes Extremity Assessment RUE Assessment RUE Assessment: Within Functional Limits LUE Assessment LUE Assessment: Within Functional Limits Mobility  Bed Mobility Bed Mobility: Yes Supine to Sit: 3: Mod assist;HOB elevated (Comment degrees);With rails (30) Sitting - Scoot to Edge of Bed: 2: Max assist Transfers Transfers: Yes Sit to Stand: 1: +2 Total  assist;From bed;From chair/3-in-1;With upper extremity assist;From elevated surface (50) Stand to Sit: 1: +2 Total assist;Patient percentage (comment);With armrests;To bed;To chair/3-in-1 (50) End of Session OT - End of Session Equipment Utilized During Treatment: Gait belt Activity Tolerance: Patient tolerated treatment well Patient left: in chair;with call bell in reach;with family/visitor present Nurse Communication: Mobility status for transfers General Behavior During Session: [facility] for tasks performed Cognition: Impaired, at baseline   Evern Bio 10/30/2011, 11:28 AM [phone]

## 2011-10-30 NOTE — Progress Notes (Signed)
ANTICOAGULATION CONSULT NOTE - Follow Up Consult  Pharmacy Consult for coumadin Indication: VTE prophylaxis s/p hip pinning  Vital Signs: Temp: 98.1 F (36.7 C) (03/23 0534) Temp src: Oral (03/23 0534) BP: 138/75 mmHg (03/23 0534) Pulse Rate: 91  (03/23 0534)  Labs:  Claudia [name] 10/30/11 0640 10/29/11 0532 10/28/11 1849 10/28/11 0655  HGB 10.7* 11.9* -- --  HCT 33.1* 37.1 -- 41.3  PLT 137* 148* -- 138*  APTT -- -- -- --  LABPROT 18.0* 15.6* 14.8 --  INR 1.46 1.21 1.14 --  HEPARINUNFRC -- -- -- --  CREATININE 1.39* 1.54* -- 1.31*  CKTOTAL -- -- -- --  CKMB -- -- -- --  TROPONINI -- -- -- --   Estimated Creatinine Clearance: 25.7 ml/min (by C-G formula based on Cr of 1.39).  Assessment: 46 yof s/p fall and subsequate hip pinning 10/28/11 started on coumadin for VTE prophylaxis. INR today remains subtherapeutic but rising toward goal.  H/H is down slightly and pt is thrombocytopenic.  Goal of Therapy:  INR 1.5-2.5   Plan:  1. Continue coumadin 3mg  PO daily 2. F/u AM INR 3. Monitor for s/s of bleeding  [employer], Pharm.D., BCPS Clinical Pharmacist Pager [phone] 10/30/2011 11:14 AM

## 2011-10-31 LAB — CBC
HCT: 33.3 % — ABNORMAL LOW (ref 36.0–46.0)
Hemoglobin: 10.9 g/dL — ABNORMAL LOW (ref 12.0–15.0)
MCH: 29.7 pg (ref 26.0–34.0)
MCHC: 32.7 g/dL (ref 30.0–36.0)
MCV: 90.7 fL (ref 78.0–100.0)
RDW: 15.8 % — ABNORMAL HIGH (ref 11.5–15.5)

## 2011-10-31 LAB — BASIC METABOLIC PANEL
BUN: 19 mg/dL (ref 6–23)
Calcium: 8.1 mg/dL — ABNORMAL LOW (ref 8.4–10.5)
Creatinine, Ser: 1.44 mg/dL — ABNORMAL HIGH (ref 0.50–1.10)
GFR calc Af Amer: 37 mL/min — ABNORMAL LOW (ref >90)
GFR calc non Af Amer: 32 mL/min — ABNORMAL LOW (ref >90)
Glucose, Bld: 81 mg/dL (ref 70–99)

## 2011-10-31 MED ORDER — WARFARIN SODIUM 4 MG PO TABS
4.0000 mg | ORAL_TABLET | Freq: Once | ORAL | Status: AC
  Administered 2011-10-31: 4 mg via ORAL
  Filled 2011-10-31: qty 1

## 2011-10-31 NOTE — Progress Notes (Signed)
Subjective: No acute complaints today.  Pt asks, "why am I still here."  I have indicated that she is awaiting placement into facility likely tomorrow.    Objective: Filed Vitals:   10/30/11 1444 10/30/11 1530 10/30/11 2113 10/31/11 0559  BP: 152/53  133/86 131/83  Pulse: 86  84 77  Temp: 97.3 F (36.3 C)  98.2 F (36.8 C) 98 F (36.7 C)  TempSrc: Oral  Oral Oral  Resp: 18 18 18 18   Height:      Weight:    58.3 kg (128 lb 8.5 oz)  SpO2: 100% 94% 99% 100%   Weight change: -0.9 kg (-1 lb 15.8 oz)  Intake/Output Summary (Last 24 hours) at 10/31/11 1013 Last data filed at 10/31/11 0700  Gross per 24 hour  Intake 2271.25 ml  Output   1550 ml  Net 721.25 ml    General: Alert, awake, oriented x3, in no acute distress.  HEENT: No bruits, no goiter.  Heart: Regular rate and rhythm, without murmurs, rubs, gallops.  Lungs: no increased work of breathing, speaking in full sentences. Abdomen: Soft, nontender, nondistended, positive bowel sounds.  Neuro: Grossly intact, nonfocal.   Lab Results:  Basename 10/31/11 0505 10/30/11 0640  NA 143 137  K 3.4* 3.3*  CL 110 107  CO2 26 22  GLUCOSE 81 89  BUN 19 22  CREATININE 1.44* 1.39*  CALCIUM 8.1* 7.7*  MG -- --  PHOS -- --   No results found for this basename: AST:2,ALT:2,ALKPHOS:2,BILITOT:2,PROT:2,ALBUMIN:2 in the last 72 hours No results found for this basename: LIPASE:2,AMYLASE:2 in the last 72 hours  Basename 10/31/11 0505 10/30/11 0640  WBC 9.1 11.3*  NEUTROABS -- --  HGB 10.9* 10.7*  HCT 33.3* 33.1*  MCV 90.7 89.5  PLT 140* 137*   No results found for this basename: CKTOTAL:3,CKMB:3,CKMBINDEX:3,TROPONINI:3 in the last 72 hours No components found with this basename: POCBNP:3 No results found for this basename: DDIMER:2 in the last 72 hours No results found for this basename: HGBA1C:2 in the last 72 hours No results found for this basename: CHOL:2,HDL:2,LDLCALC:2,TRIG:2,CHOLHDL:2,LDLDIRECT:2 in the last 72 hours No  results found for this basename: TSH,T4TOTAL,FREET3,T3FREE,THYROIDAB in the last 72 hours No results found for this basename: VITAMINB12:2,FOLATE:2,FERRITIN:2,TIBC:2,IRON:2,RETICCTPCT:2 in the last 72 hours  Micro Results: Recent Results (from the past 240 hour(s))  URINE CULTURE     Status: Normal   Collection Time   10/27/11  3:42 PM      Component Value Range Status Comment   Specimen Description URINE, CATHETERIZED   Final    Special Requests NONE   Final    Culture  Setup Time [id number]   Final    Colony Count >=100,000 COLONIES/ML   Final    Culture ENTEROCOCCUS SPECIES   Final    Report Status 10/30/2011 FINAL   Final    Organism ID, Bacteria ENTEROCOCCUS SPECIES   Final   MRSA PCR SCREENING     Status: Normal   Collection Time   10/30/11  3:40 AM      Component Value Range Status Comment   MRSA by PCR NEGATIVE  NEGATIVE  Final     Studies/Results: No results found.  Medications: I have reviewed the patient's current medications.   Patient Active Hospital Problem List: Closed subcapital fracture of femur (10/28/2011) Patient is s/p left hip percutaneous screw fixation.  DEMENTIA (09/07/2006) Stable currently patient is on aricept and namenda  HYPERTENSION (09/07/2006) Well controlled at this juncture.  Will plan on continuing current regimen  and continue to monitor.  COPD (09/07/2006) Stable patient mentions that she is not on any oxygen at home.  Will try to wean to room air today.  RENAL INSUFFICIENCY (12/30/2008) Based on GFR patient would be categorized as CKD stage III.  Patient seems to be at baseline.  Baseline ~ 1.3-1.4      LOS: 4 days   Penny Pia M.D.  [facility] 10/31/2011, 10:13 AM

## 2011-10-31 NOTE — Discharge Summary (Addendum)
Physician Discharge Summary  Patient ID: Claudia [name] MRN: [id number] DOB/AGE: 02/23/1923 78 y.o.  Admit date: 10/27/2011 Discharge date: 10/31/2011  Primary Care Physician:  Willow Ora, MD, MD   Discharge Diagnoses:    Principal Problem:  *Closed subcapital fracture of femur Active Problems:  DEMENTIA  HYPERTENSION  COPD  RENAL INSUFFICIENCY    Medication List  As of 10/31/2011 10:23 AM   ASK your doctor about these medications         acetaminophen 325 MG tablet   Commonly known as: TYLENOL   Take 1 tablet (325 mg total) by mouth at bedtime.      AMBULATORY NON FORMULARY MEDICATION   aveeno OTC twice a day, keep LEFT  leg elevated at least 1 hour twice a day      aspirin 81 MG tablet   Take 81 mg by mouth every other day.      ASTEPRO 0.15 % Soln   Generic drug: Azelastine HCl   Place 1 spray into the nose as needed.      calcium carbonate (dosed in mg elemental calcium) 1250 MG/5ML   Take 500 mg of elemental calcium by mouth daily.      calcium citrate 950 MG tablet   Commonly known as: CALCITRATE - dosed in mg elemental calcium   Take 1 tablet by mouth daily.      CENTRUM ULTRA WOMENS PO   Take by mouth.      cetirizine 10 MG tablet   Commonly known as: ZYRTEC   Take 10 mg by mouth daily.      cholecalciferol 1000 UNITS tablet   Commonly known as: VITAMIN D   Take 1,000 Units by mouth daily.      donepezil 10 MG tablet   Commonly known as: ARICEPT   Take 10 mg by mouth at bedtime.      doxercalciferol 0.5 MCG capsule   Commonly known as: HECTOROL   1 po M,W,F.      doxycycline 100 MG tablet   Commonly known as: ADOXA   Take 100 mg by mouth 2 (two) times daily.      IMODIUM ADVANCED 2-125 MG Tabs   Generic drug: Loperamide-Simethicone   Take by mouth as needed. No more than 3/day      lactobacillus rhamnosus (GG) 10 B CELL capsule   Commonly known as: CULTURELLE   Take 1 capsule by mouth daily.      levothyroxine 100 MCG tablet   Commonly  known as: SYNTHROID, LEVOTHROID   Take 1 tablet (100 mcg total) by mouth daily.      memantine 5 MG tablet   Commonly known as: NAMENDA   Take 5 mg by mouth 2 (two) times daily.      sertraline 100 MG tablet   Commonly known as: ZOLOFT   Take 100 mg by mouth daily.      traMADol 50 MG tablet   Commonly known as: ULTRAM   Take 50 mg by mouth every 6 (six) hours as needed. Patient is given this medication for pain.      triamterene-hydrochlorothiazide 37.5-25 MG per capsule   Commonly known as: DYAZIDE   Take 1 capsule by mouth daily.             Disposition and Follow-up:  Pt will require SNF with rehab.  Please follow up with orthopaedic surgeon in 3 weeks or sooner should any concerns arise.  Consults: Orthopaedic surgeon Jerrilyn Cairo)   Significant Diagnostic  Studies:  Dg Chest 1 View  10/27/2011  *RADIOLOGY REPORT*  Clinical Data: Larey Seat on Monday with left hip pain  CHEST - 1 VIEW  Comparison: Chest x-ray of 07/17/2009  Findings: The lungs are clear.  The heart is mildly enlarged.  The bones are osteopenic.  Surgical clips overlie the left axilla.  IMPRESSION: No active lung disease.  Mild cardiomegaly.  Osteopenia.  Original Report Authenticated By: Juline Patch, M.D.   Dg Hip Complete Left  10/27/2011  *RADIOLOGY REPORT*  Clinical Data: Status post fall.  Pain.  LEFT HIP - COMPLETE 2+ VIEW  Comparison: Plain film the pelvis 08/11/2009.  Findings: The patient has an acute subcapital fracture of the left hip.  No other fracture is identified.  Femoral heads are located bilaterally.  IMPRESSION: Acute subcapital fracture left hip.  Per CMS PQRS reporting requirements (PQRS Measure 24): Given the patient's age of greater than 50 and the fracture site (hip, distal radius, or spine), the patient should be tested for osteoporosis using DXA, and the appropriate treatment considered based on the DXA results.  Original Report Authenticated By: Bernadene Bell. Maricela Curet, M.D.   Ct  Head Wo Contrast  10/27/2011  *RADIOLOGY REPORT*  Clinical Data: .  Shaking.  Recent fall.  History of strokes. History of hypertension, breast cancer and dementia.  CT HEAD WITHOUT CONTRAST  Technique:  Contiguous axial images were obtained from the base of the skull through the vertex without contrast.  Comparison: 08/14/2010 and previous  Findings: The brain shows generalized atrophy.  There are extensive old small vessel infarctions throughout the hemispheric white matter.  No sign of acute infarction, mass lesion, hemorrhage, hydrocephalus or extra-axial collection.  No calvarial lesion. Small calvarial lucencies have not changed.  No skull fracture.  No fluid in the sinuses, middle ears or mastoids.  IMPRESSION: No acute or traumatic finding.  Atrophy and extensive chronic small vessel disease throughout the brain.  Original Report Authenticated By: Thomasenia Sales, M.D.    Brief H and P:  For complete details please refer to admission H and P, but in brief Patient is a 78 y/o with history of Dementia, COPD, CKD III, HTN  that presented to the hospital after a fall.  Further work up in the ED showed patient had a closed subcapital fracture of femur.  Subsequently was evaluated by orthopaedic surgeon.  Patient was taken for left hip percutaneous screw fixation.  She tolerated the procedure well.  Patient will subsequently go to SNF with physical therapy.   Hospital Course:  Principal Problem:  *Closed subcapital fracture of femur s/p left hip percutaneous screw fixation Active Problems:  DEMENTIA  HYPERTENSION  COPD  RENAL INSUFFICIENCY CKD stage III   Time spent on Discharge: 35 minutes  Signed: Penny Pia [facility] Pager: [phone] 10/31/2011, 10:23 AM  Addendum: Patient has no complaints today and is ready for discharge.  Will require physical therapy at the SNF.  Patient is to continue her warfarin and will need to get her INR recheck within this week.    Physical  exam:  General: Alert, awake, oriented x3, in no acute distress. HEENT: No bruits, no goiter. Heart: Regular rate and rhythm, without murmurs, rubs, gallops. Lungs: Clear to auscultation bilaterally. Abdomen: Soft, nontender, nondistended, positive bowel sounds. Extremities: No clubbing cyanosis or edema with positive pedal pulses. Neuro: Grossly intact, nonfocal.  A/p as indicated above.

## 2011-10-31 NOTE — Progress Notes (Signed)
PATIENT ID: Claudia [name]  MRN: [id number]  DOB/AGE:  [birth year] / 78 y.o.  3 Days Post-Op Procedure(s) (LRB): CANNULATED HIP PINNING (Left)  Subjective: Pain is mild.  No c/o chest pain or SOB.   No new c/o.   Objective: Vital signs in last 24 hours: Temp:  [97.3 F (36.3 C)-98.2 F (36.8 C)] 98 F (36.7 C) (03/24 0559) Pulse Rate:  [77-86] 77  (03/24 0559) Resp:  [18] 18  (03/24 0559) BP: (131-152)/(53-86) 131/83 mmHg (03/24 0559) SpO2:  [94 %-100 %] 100 % (03/24 0559) Weight:  [58.3 kg (128 lb 8.5 oz)] 58.3 kg (128 lb 8.5 oz) (03/24 0559)  Intake/Output from previous day: 03/23 0701 - 03/24 0700 In: 2271.3 [P.O.:470; I.V.:1801.3] Out: 1550 [Urine:1550] Intake/Output this shift: Total I/O In: 120 [P.O.:120] Out: -    Basename 10/31/11 0505 10/30/11 0640 10/29/11 0532  HGB 10.9* 10.7* 11.9*    Basename 10/31/11 0505 10/30/11 0640  WBC 9.1 11.3*  RBC 3.67* 3.70*  HCT 33.3* 33.1*  PLT 140* 137*    Basename 10/31/11 0505 10/30/11 0640  NA 143 137  K 3.4* 3.3*  CL 110 107  CO2 26 22  BUN 19 22  CREATININE 1.44* 1.39*  GLUCOSE 81 89  CALCIUM 8.1* 7.7*    Basename 10/31/11 0505 10/30/11 0640  LABPT -- --  INR 1.42 1.46    Physical Exam: Neurovascular intact Dorsiflexion/Plantar flexion intact Incision: no drainage Dressing d/c'd  Assessment/Plan: 3 Days Post-Op Procedure(s) (LRB): CANNULATED HIP PINNING (Left)   Up with therapy Discharge to SNF tomorrow Weight Bearing as Tolerated (WBAT)  VTE prophylaxis: pharmacologic prophylaxis (with any of the following: warfarin adjusted-dose) x 30 days F/u with me 10-14 days.   Mable Paris 10/31/2011, 10:41 AM

## 2011-10-31 NOTE — Progress Notes (Signed)
ANTICOAGULATION CONSULT NOTE - Follow Up Consult  Pharmacy Consult for coumadin Indication: VTE prophylaxis s/p hip pinning  Vital Signs: Temp: 98 F (36.7 C) (03/24 0559) Temp src: Oral (03/24 0559) BP: 131/83 mmHg (03/24 0559) Pulse Rate: 77  (03/24 0559)  Labs:  Basename 10/31/11 0505 10/30/11 0640 10/29/11 0532  HGB 10.9* 10.7* --  HCT 33.3* 33.1* 37.1  PLT 140* 137* 148*  APTT -- -- --  LABPROT 17.6* 18.0* 15.6*  INR 1.42 1.46 1.21  HEPARINUNFRC -- -- --  CREATININE 1.44* 1.39* 1.54*  CKTOTAL -- -- --  CKMB -- -- --  TROPONINI -- -- --   Estimated Creatinine Clearance: 24.8 ml/min (by C-G formula based on Cr of 1.44).  Assessment: 78 yo F s/p fall and subsequate hip pinning 10/28/11 started on coumadin for VTE prophylaxis. INR today remains subtherapeutic with slight decline.   Goal of Therapy:  INR 1.5-2.5   Plan:  1. Increase coumadin to 4mg  PO x 1 tonight.  2. F/u AM INR 3. Monitor for s/s of bleeding  [employer], Pharm.D., BCPS Clinical Pharmacist Pager [phone] 10/31/2011 10:51 AM

## 2011-11-01 LAB — CBC
HCT: 33.9 % — ABNORMAL LOW (ref 36.0–46.0)
MCH: 29.3 pg (ref 26.0–34.0)
MCHC: 32.4 g/dL (ref 30.0–36.0)
MCV: 90.4 fL (ref 78.0–100.0)
RDW: 15.7 % — ABNORMAL HIGH (ref 11.5–15.5)
WBC: 8 10*3/uL (ref 4.0–10.5)

## 2011-11-01 LAB — BASIC METABOLIC PANEL
BUN: 14 mg/dL (ref 6–23)
Calcium: 8 mg/dL — ABNORMAL LOW (ref 8.4–10.5)
Chloride: 111 mEq/L (ref 96–112)
Creatinine, Ser: 1.25 mg/dL — ABNORMAL HIGH (ref 0.50–1.10)
GFR calc Af Amer: 44 mL/min — ABNORMAL LOW (ref >90)

## 2011-11-01 MED ORDER — WARFARIN SODIUM 4 MG PO TABS
4.0000 mg | ORAL_TABLET | Freq: Once | ORAL | Status: DC
  Filled 2011-11-01: qty 1

## 2011-11-01 NOTE — Progress Notes (Signed)
DC Iv, DC Tele, DC SNF. Discharge instructions and home medications discussed with patient and patient's family. Patient and patient's family denies any questions or concerns at this time. Patient leaving unit via ambulance and appears to be in no acute distress.

## 2011-11-01 NOTE — Progress Notes (Signed)
Physical Therapy Treatment Patient Details Name: Golden Emile MRN: [id number] DOB: 1922-08-27 Today's Date: 11/01/2011  PT Assessment/Plan  PT - Assessment/Plan Comments on Treatment Session: pt able to focus on task with repetitive cues.  Short term memory is poor and she forgets cues quickly.  Ready for SNF for rehab PT Plan: Discharge plan remains appropriate Follow Up Recommendations: Skilled nursing facility Equipment Recommended: Defer to next venue PT Goals  Acute Rehab PT Goals PT Goal: Supine/Side to Sit - Progress: Progressing toward goal PT Goal: Sit to Stand - Progress: Progressing toward goal PT Transfer Goal: Bed to Chair/Chair to Bed - Progress: Progressing toward goal  PT Treatment Precautions/Restrictions  Precautions Precautions: Fall Required Braces or Orthoses: No Restrictions Weight Bearing Restrictions: Yes LLE Weight Bearing: Weight bearing as tolerated Mobility (including Balance) Bed Mobility Supine to Sit: 3: Mod assist;HOB elevated (Comment degrees);Other (comment) (15 degrees) Supine to Sit Details (indicate cue type and reason): vc's for bridging technique, hand placement and technique; assist coming up to sit and assist for bridge Sitting - Scoot to Edge of Bed: 2: Max assist Transfers Sit to Stand: 3: Mod assist;Patient percentage (comment);With upper extremity assist;From bed;Other (comment) (pt =50%; times 3 incl. 2 ~25 sec stands for pericare) Sit to Stand Details (indicate cue type and reason): vc/tc's for technique and hand placement; lifting assist to get up and forward over her BOS Stand to Sit: 3: Mod assist;To chair/3-in-1 Stand Pivot Transfers: 3: Mod assist;Patient percentage (comment);From elevated surface (pt=50%) Stand Pivot Transfer Details (indicate cue type and reason): use RW; vc's for sequencing and assist for balance and maeuvering with the RW Ambulation/Gait Ambulation/Gait: No Stairs: No Wheelchair Mobility Wheelchair  Mobility: No  Posture/Postural Control Posture/Postural Control: No significant limitations Balance Balance Assessed: No Exercise  Total Joint Exercises Ankle Circles/Pumps: AROM;AAROM;15 reps;Seated Heel Slides: AAROM;Left;10 reps;Supine Hip ABduction/ADduction: AAROM;Left;5 reps;Supine End of Session PT - End of Session Activity Tolerance: Patient limited by pain;Treatment limited secondary to agitation Patient left: in chair;with call bell in reach;with family/visitor present Nurse Communication: Mobility status for transfers;Mobility status for ambulation General Behavior During Session: [facility] for tasks performed Cognition: Impaired, at baseline  Clifford Benninger, Eliseo Gum 11/01/2011, 12:20 PM  11/01/2011  Kinney Bing, PT [phone] (pager)

## 2011-11-01 NOTE — Progress Notes (Signed)
ANTICOAGULATION CONSULT NOTE - Follow Up Consult  Pharmacy Consult for coumadin Indication: VTE prophylaxis s/p hip pinning  Vital Signs: Temp: 97.8 F (36.6 C) (03/25 0447) Temp src: Oral (03/25 0447) BP: 153/77 mmHg (03/25 0447) Pulse Rate: 72  (03/25 0447)  Labs:  Basename 11/01/11 3086 10/31/11 0505 10/30/11 0640  HGB 11.0* 10.9* --  HCT 33.9* 33.3* 33.1*  PLT 156 140* 137*  APTT -- -- --  LABPROT 18.7* 17.6* 18.0*  INR 1.53* 1.42 1.46  HEPARINUNFRC -- -- --  CREATININE 1.25* 1.44* 1.39*  CKTOTAL -- -- --  CKMB -- -- --  TROPONINI -- -- --   Estimated Creatinine Clearance: 28.5 ml/min (by C-G formula based on Cr of 1.25).  Assessment: 78 yo F s/p fall and subsequate hip pinning 10/28/11 started on coumadin for VTE prophylaxis. INR is within the desired goal range of 1.5-2.5.  No noted bleeding complications and H/H is stable this AM.  Platelets are wnl as well.   Goal of Therapy:  INR 1.5-2.5   Plan:  1. Repeat coumadin 4mg  PO x 1 tonight.  2. F/u AM INR 3. Monitor for s/s of bleeding  Nadara Mustard, [place].D., MS Clinical Pharmacist Pager [phone] 11/01/2011 11:15 AM

## 2011-11-01 NOTE — Clinical Documentation Improvement (Signed)
Abnormal Labs Clarification  THIS DOCUMENT IS NOT A PERMANENT PART OF THE MEDICAL RECORD  Please update your documentation within the medical record to reflect your response to this query.                                                                                    11/01/11  Dr. Cena Benton and/or Associates,  In a better effort to capture your patient's severity of illness, reflect appropriate length of stay and utilization of resources, a review of the medical record has revealed the following indicators.    Based on your clinical judgment, please clarify and document in a progress note and/or discharge summary the clinical condition associated with the following supporting information:   - Urine Cx showing >100,000 Enterococcus  10/30/11   In responding to this query please exercise your independent judgment.  The fact that a query is asked, does not imply that any particular answer is desired or expected.  Reviewed: additional documentation in the medical record   Thank You,  Jerral Ralph RN BSN Certified Clinical Documentation Specialist: Cell   [phone]  Health Information Management [place]   TO RESPOND TO THE THIS QUERY, FOLLOW THE INSTRUCTIONS BELOW:  1. If needed, update documentation for the patient's encounter via the notes activity.  2. Access this query again and click edit on the [employer].  3. After updating, or not, click F2 to complete all highlighted (required) fields concerning your review. Select "additional documentation in the medical record" OR "no additional documentation provided".  4. Click Sign note button.  5. The deficiency will fall out of your InBasket *Please let us know if you are not able to complete this workflow by phone or e-mail (listed below). Please review HPI.  UTI seems to be uncomplicated and patient was asymptomatic.  Received antibiotic treatment and improved.  She remained asymptomatic and WBC count trending  down.  Sincerely  [employer]

## 2011-11-01 NOTE — Progress Notes (Signed)
.  Clinical social worker assisted with patient discharge to skilled nursing facility, [facility]. .Patient transportation provided by [employer] [facility] with patient chart copy.  Please see completed placement not below.  .No further Clinical Social Work needs, signing off.  Off  Clinical Social Work Department CLINICAL SOCIAL WORK PLACEMENT NOTE 11/01/2011  Patient:  [name],Claudia  Account Number:  [id number] Admit date:  10/27/2011  Clinical Social Worker:  Doree Albee  Date/time:  10/29/2011 03:00 PM  Clinical Social Work is seeking post-discharge placement for this patient at the following level of care:   SKILLED NURSING   (*CSW will update this form in Epic as items are completed)   10/29/2011  Patient/family provided with [facility] Department of Clinical Social Work's list of facilities offering this level of care within the geographic area requested by the patient (or if unable, by the patient's family).  10/29/2011  Patient/family informed of their freedom to choose among providers that offer the needed level of care, that participate in Medicare, Medicaid or managed care program needed by the patient, have an available bed and are willing to accept the patient.  10/29/2011  Patient/family informed of [facility]' ownership interest in [facility], as well as of the fact that they are under no obligation to receive care at this facility.  PASARR submitted to EDS on 10/29/2011 PASARR number received from [employer] on 10/29/2011  FL2 transmitted to all facilities in geographic area requested by pt/family on  10/29/2011 FL2 transmitted to all facilities within larger geographic area on   Patient informed that his/her managed care company has contracts with or will negotiate with  certain facilities, including the following:     Patient/family informed of bed offers received:  10/29/2011 Patient chooses bed at [facility] Physician recommends  and patient chooses bed at    Patient to be transferred to [facility] on  11/01/2011 Patient to be transferred to facility by [facility]tar  The following physician request were entered in Epic:   Additional Comments:

## 2011-11-01 NOTE — Consult Note (Signed)
WOC follow up Pt came in this admission with open wound of LLE, and dry boots (kerlix and coban wraps)  Had ortho tech replace dry boots and placed foam dressing to open area of the LLE.  Will need SNF to eval and redress wound to LLE weekly with foam as long as open. May not need continuation of wraps if edema control not an issue.  Recommended to family to keep follow up with Dr. Kelly Splinter in [facility] if R great toe pain continues.  [place], [place] [phone]

## 2011-11-02 ENCOUNTER — Ambulatory Visit: Payer: Medicare Other | Admitting: Cardiovascular Disease

## 2011-11-10 ENCOUNTER — Encounter (HOSPITAL_BASED_OUTPATIENT_CLINIC_OR_DEPARTMENT_OTHER): Payer: Medicare Other

## 2012-01-23 ENCOUNTER — Telehealth: Payer: Self-pay | Admitting: Internal Medicine

## 2012-01-23 NOTE — Telephone Encounter (Signed)
Please check on patient, is she doing ok?  She did need her thyroid to be recheck (TSH , dx hypothyroidism. Let me know if I can help.

## 2012-01-24 NOTE — Telephone Encounter (Signed)
Spoke with pt's daughter Maralyn Sago & she states that the pt is doing okay & that her hip is healing. Maralyn Sago also stated that we may be receiving paper work stating that the Dr. At the facility will be taking over care of the pt but sarah said to ignore that paper work & she still wanted Dr. Drue Novel to continue to provide care.

## 2012-01-24 NOTE — Telephone Encounter (Signed)
Maralyn Sago said she will bring her here.

## 2012-01-24 NOTE — Telephone Encounter (Signed)
Okay, just tell Claudia [name] that we need to check a TSH, if she won't  be able to come back in the next 2 or 3 months, please ask them to get a TSH at the facility she is at and send it to me.

## 2012-07-25 IMAGING — CR DG KNEE COMPLETE 4+V*L*
4 series · 4 of 4 positions shown · non-contrast
Comparison: None

CLINICAL DATA: Laceration, sore, redness, fall

LEFT KNEE - COMPLETE 4+ VIEW

[t knee ap left]
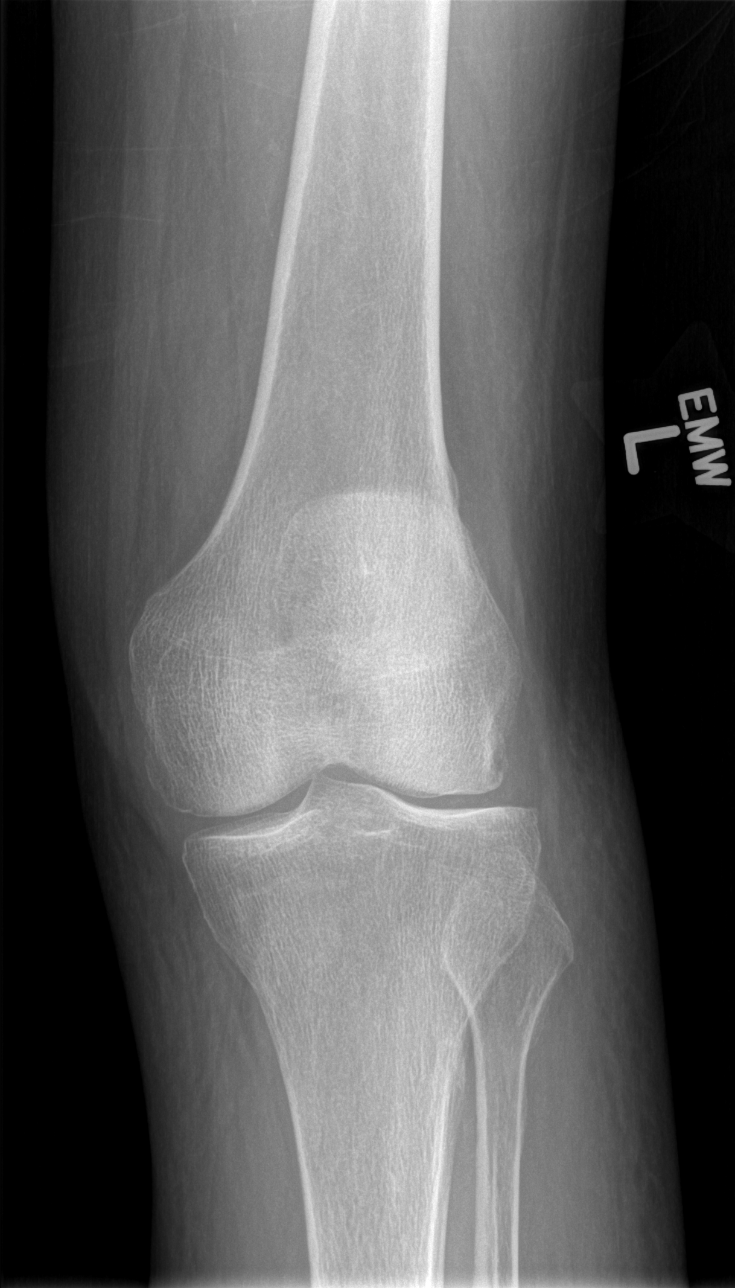

[t knee oblique left (1 of 2)]
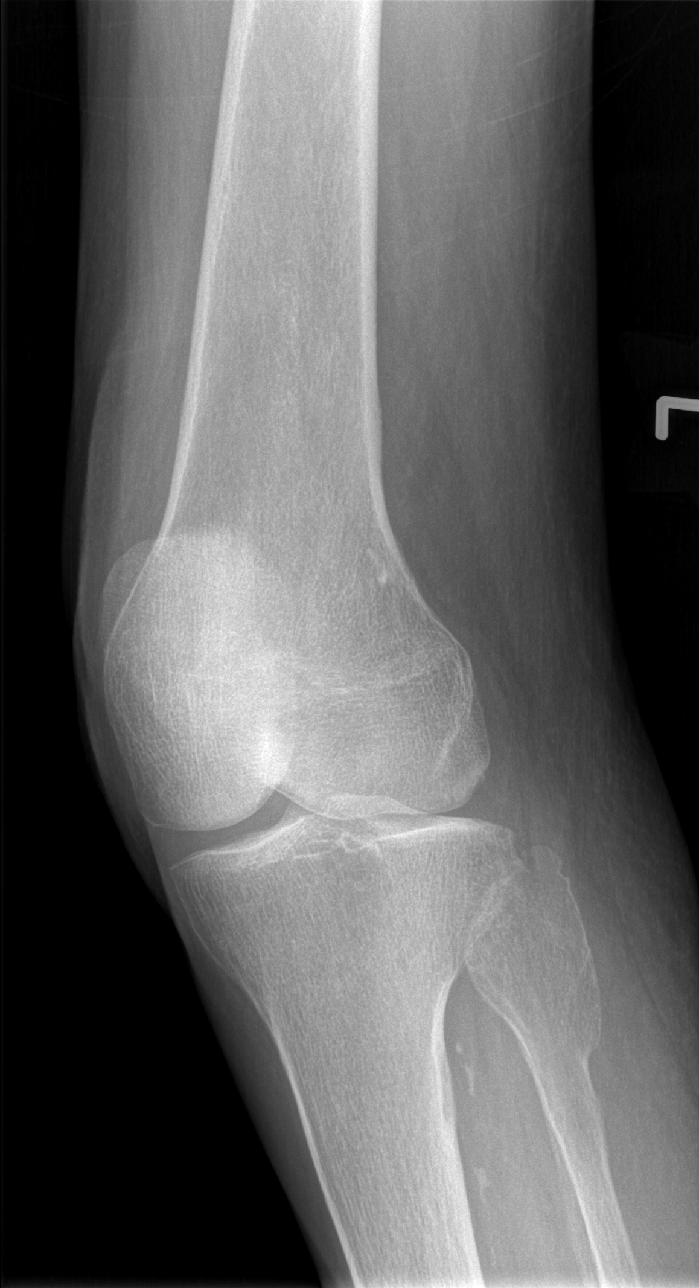

[t knee oblique left (2 of 2)]
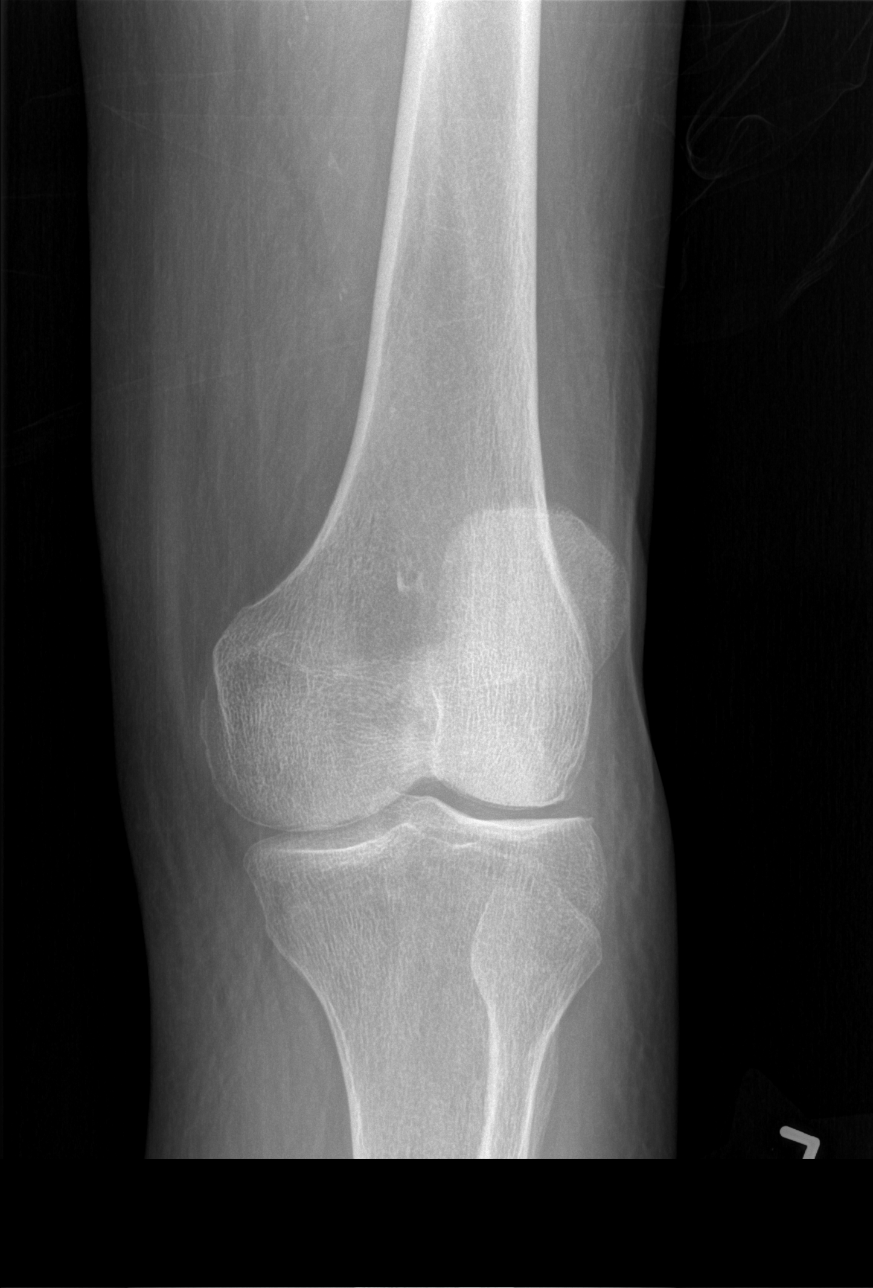

[t knee lat left]
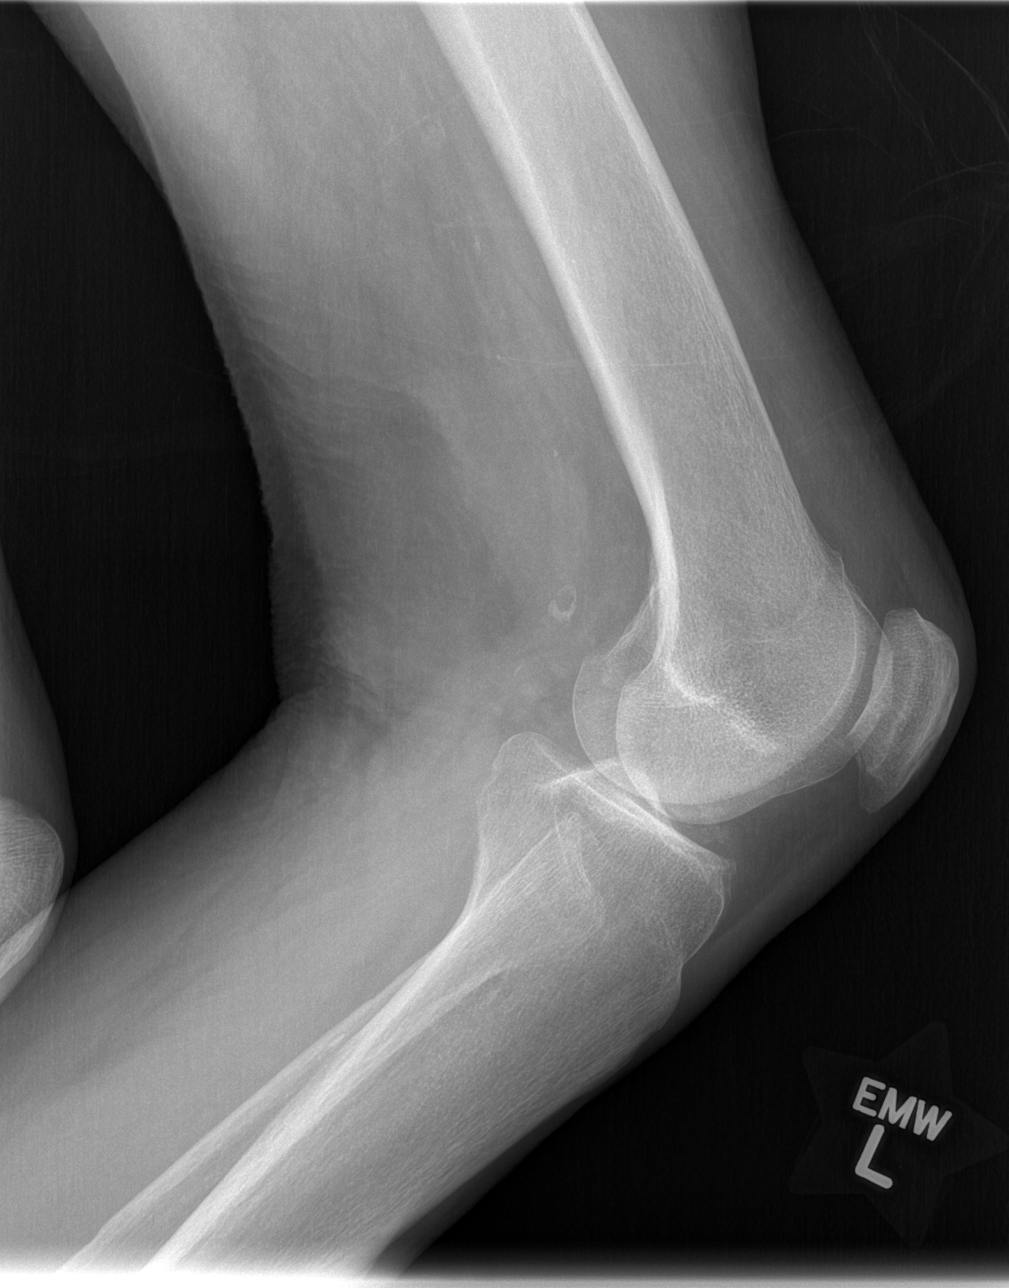

[4 of 4 positions shown; findings below may reference images not displayed]

FINDINGS: Diffuse osseous demineralization.
Joint space narrowing.
Regional soft tissue swelling.
No acute fracture, dislocation, or bone destruction.
Mild chondrocalcinosis consistent with CPPD/pseudogout.
Scattered atherosclerotic calcifications.
No knee joint effusion.
IMPRESSION: Degenerative changes with CPPD/pseudogout.
Osseous demineralization.
No acute bony abnormalities.

## 2012-07-25 IMAGING — US US EXTREM LOW VENOUS*L*
1 series · 14 of 22 positions shown · non-contrast
Comparison: None

CLINICAL DATA: Swelling and discoloration post fall.

LEFT LOWER EXTREMITY VENOUS DOPPLER ULTRASOUND
TECHNIQUE: Gray-scale sonography with compression, as well as color
and duplex ultrasound, were performed to evaluate the deep venous
system from the level of the common femoral vein through the
popliteal and proximal calf veins.

[Series 1: us extrem low venous*left* · 14 of 22 slices shown]
[im 1/22]
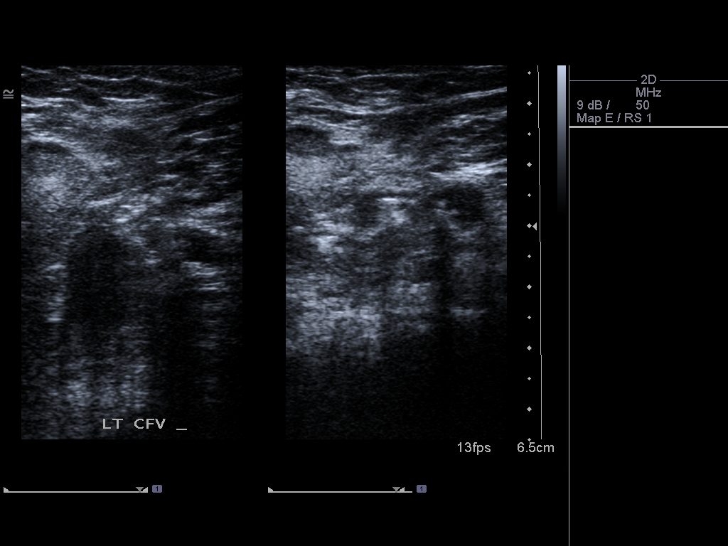
[im 3/22]
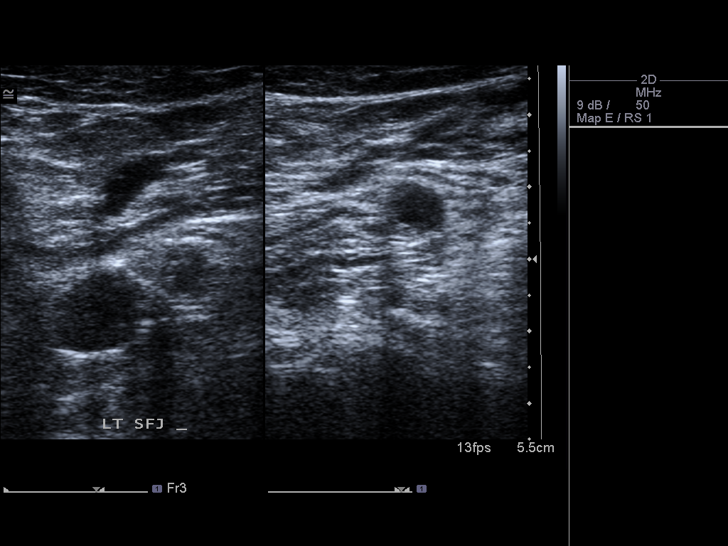
[im 4/22]
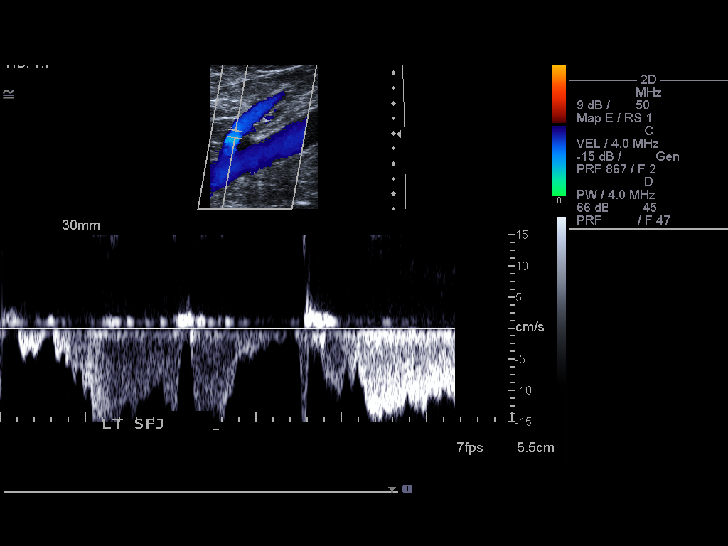
[im 6/22]
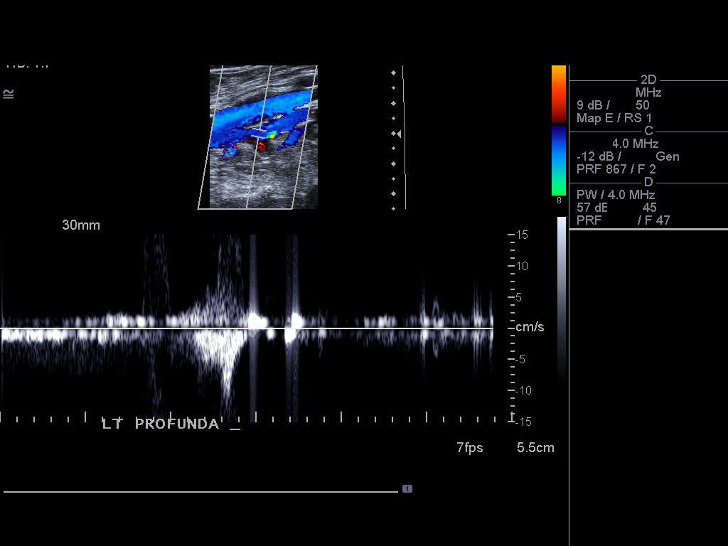
[im 8/22]
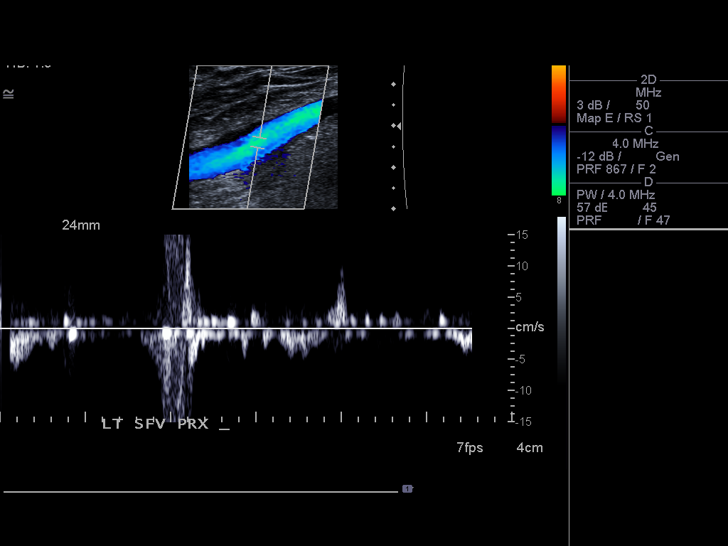
[im 9/22]
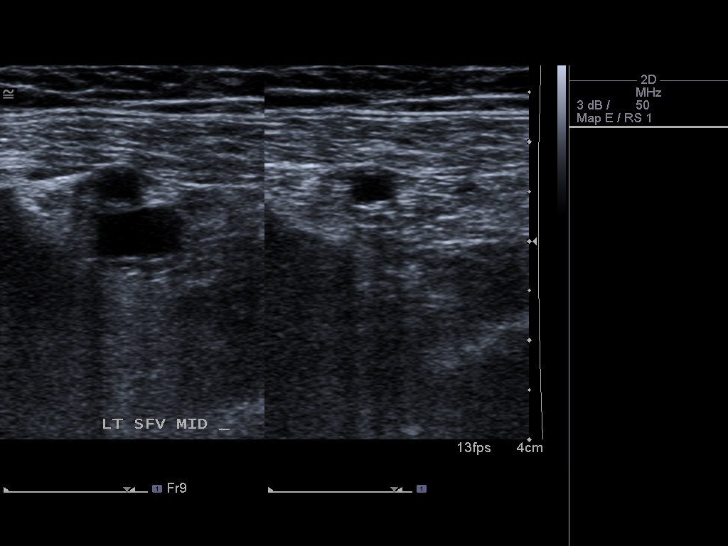
[im 11/22]
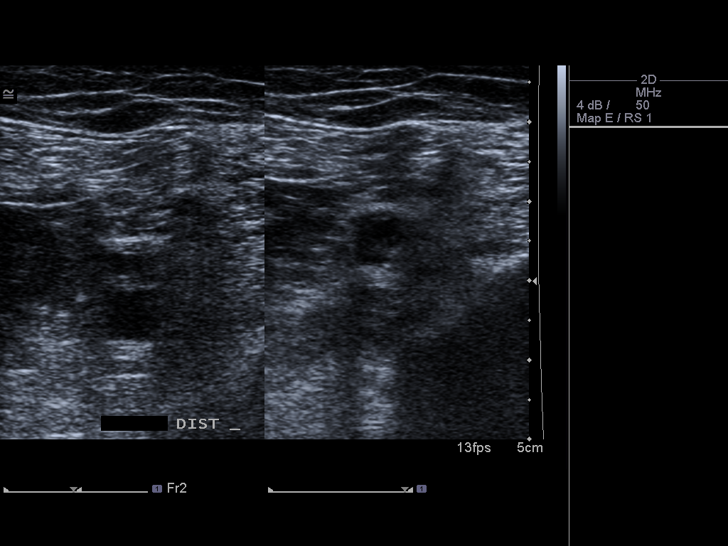
[im 12/22]
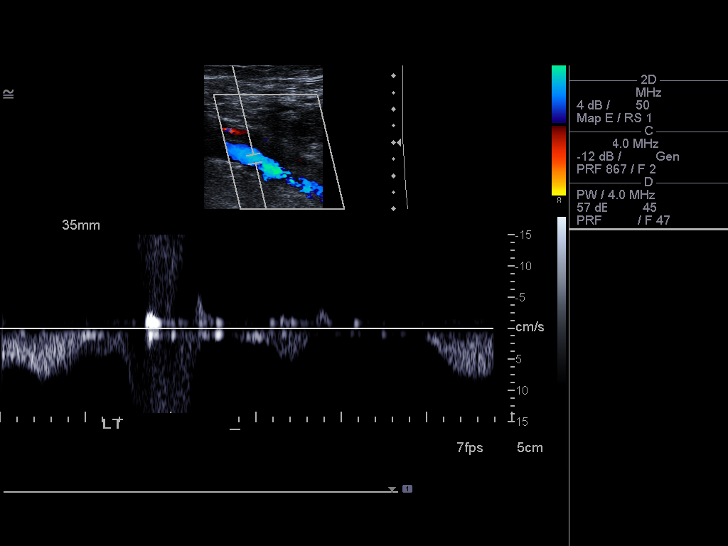
[im 14/22]
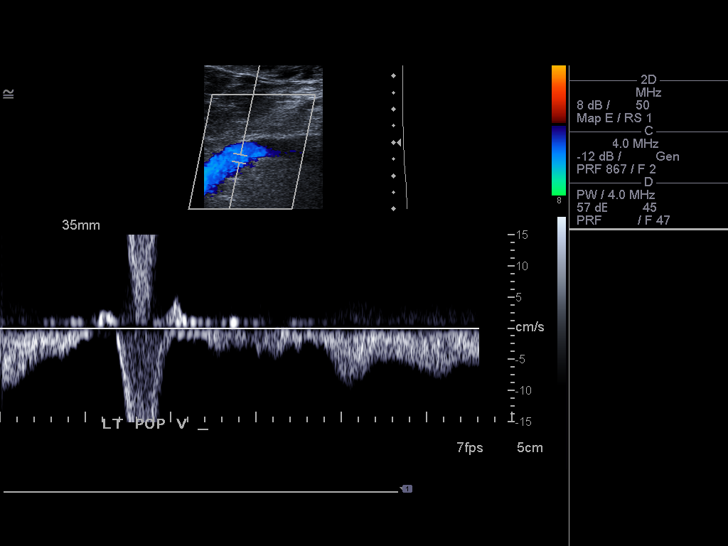
[im 15/22]
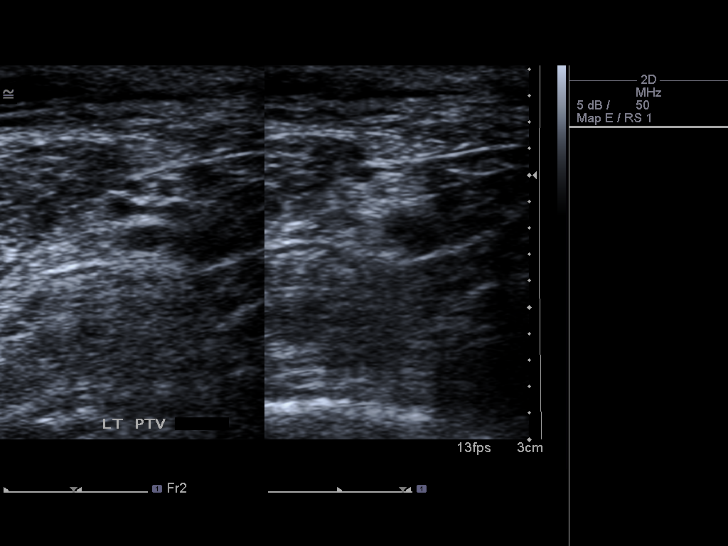
[im 17/22]
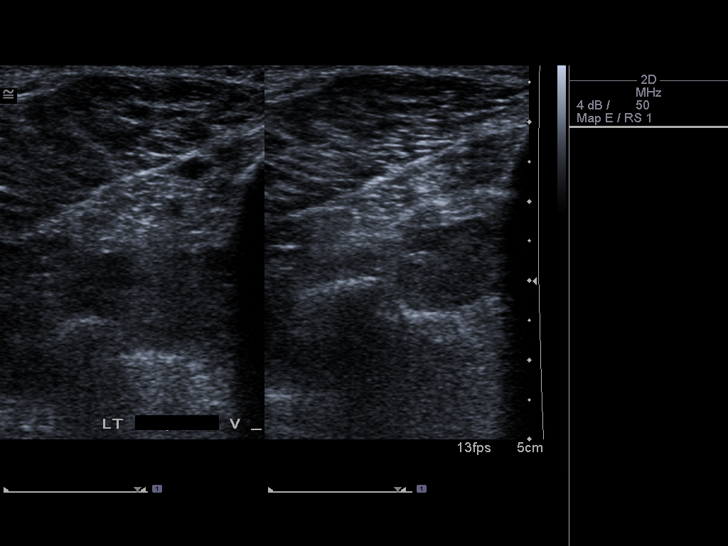
[im 19/22]
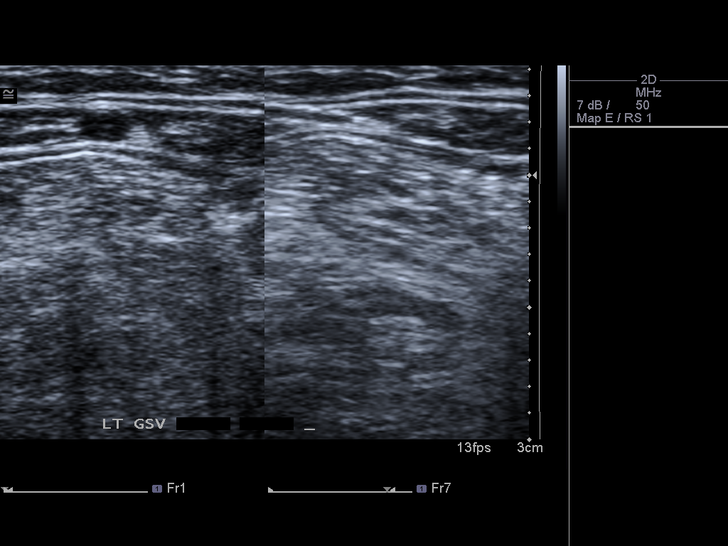
[im 20/22]
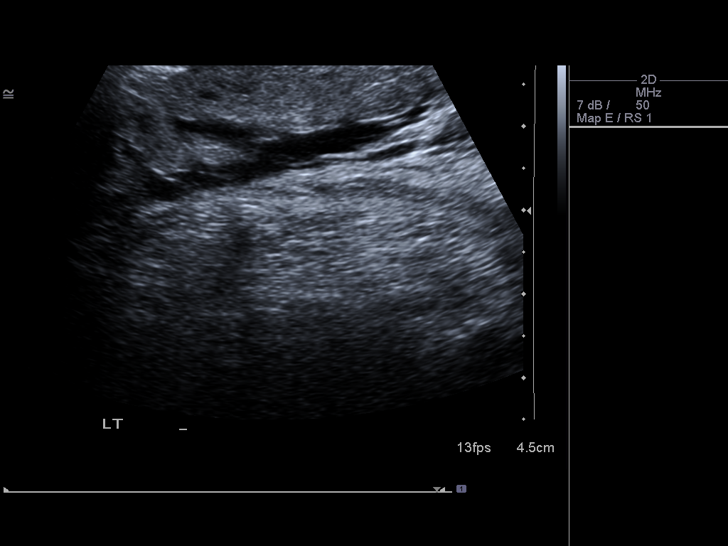
[im 22/22]
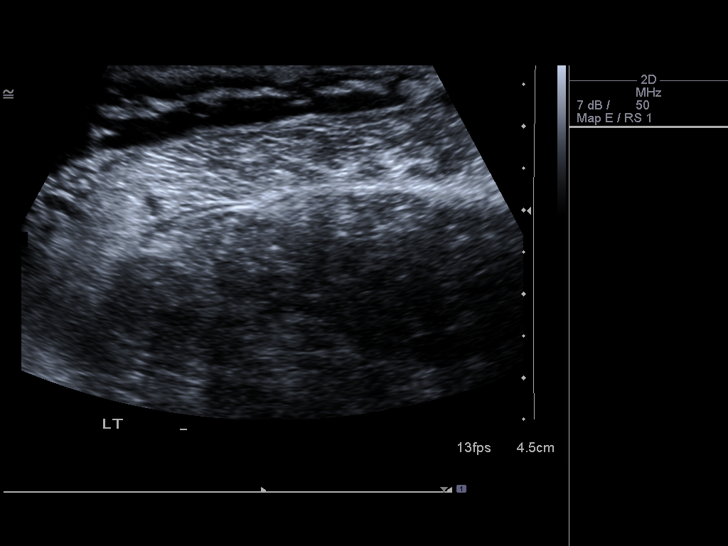

[14 of 22 positions shown; findings below may reference images not displayed]

FINDINGS: Normal compressibility of  the common femoral,
superficial femoral, and popliteal veins, as well as the proximal
calf veins.  No filling defects to suggest DVT on grayscale or
color Doppler imaging.  Doppler waveforms show normal direction of
venous flow, normal respiratory phasicity and response to
augmentation. There is subcutaneous edema in the calf.
IMPRESSION: No evidence of  lower extremity deep vein thrombosis.

## 2012-07-25 IMAGING — CR DG TIBIA/FIBULA 2V*L*
2 series · 2 of 2 positions shown · non-contrast
Comparison: None

CLINICAL DATA: Laceration, pain, tenderness, redness, soreness,
fall

LEFT TIBIA AND FIBULA - 2 VIEW

[t tib/fib lat left]
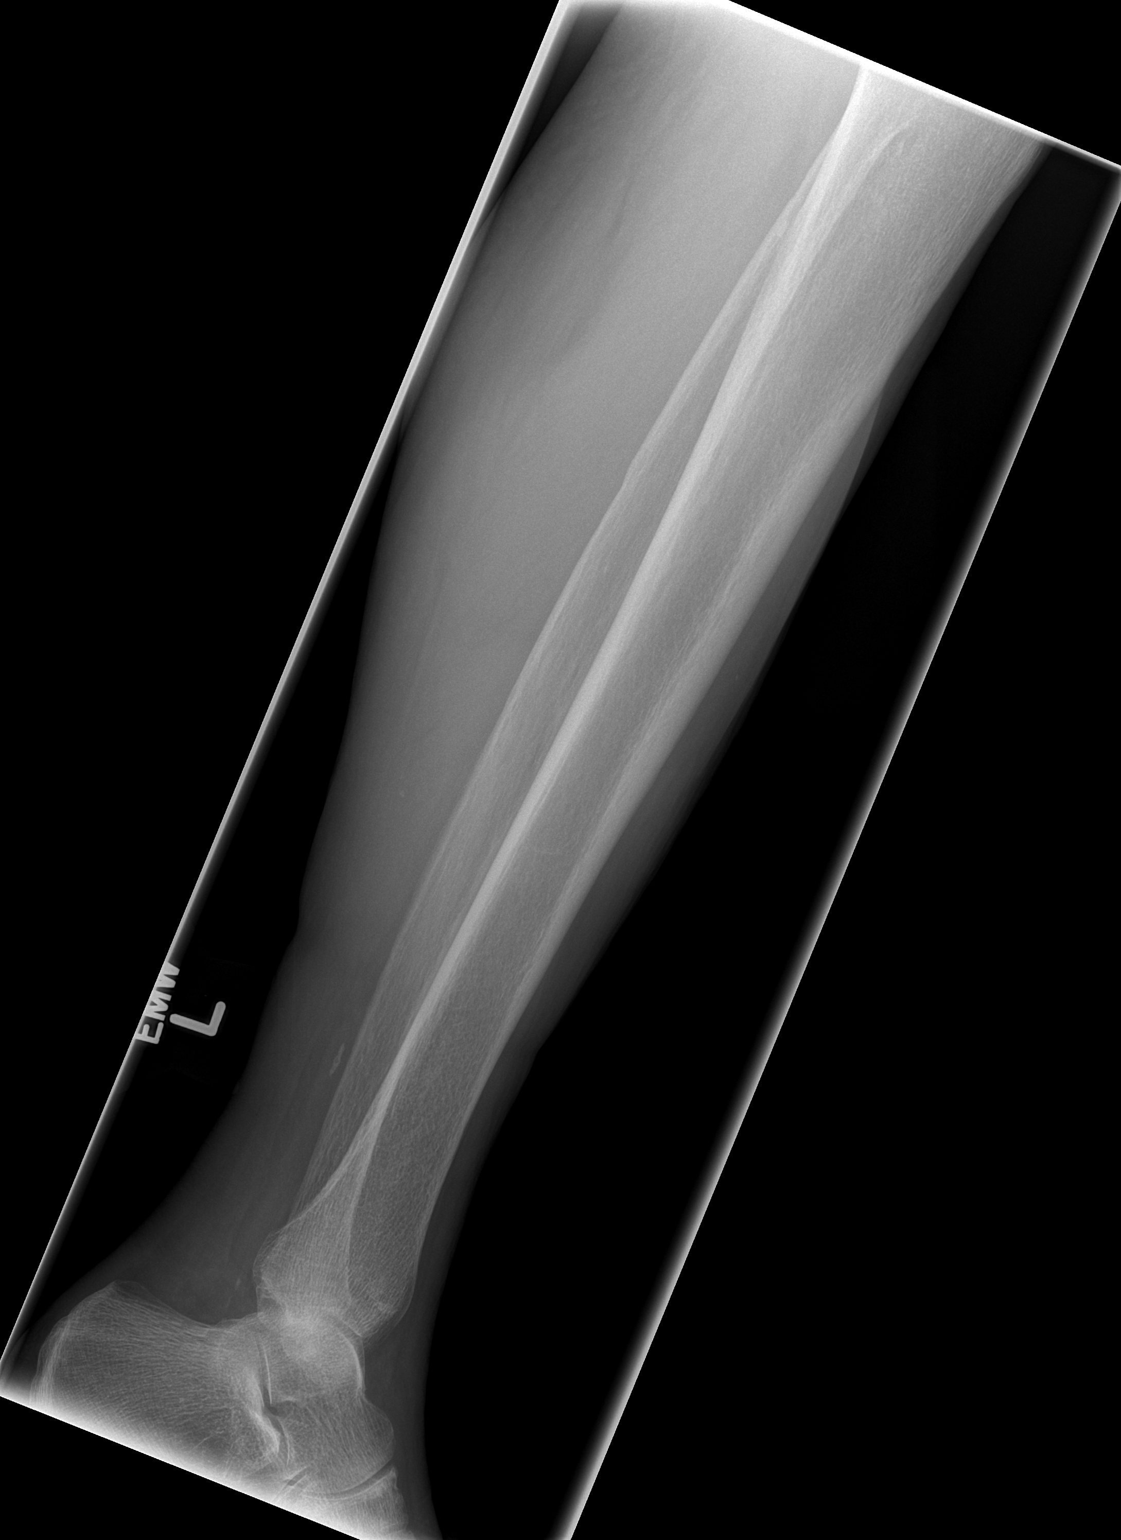

[t tib/fib ap left]
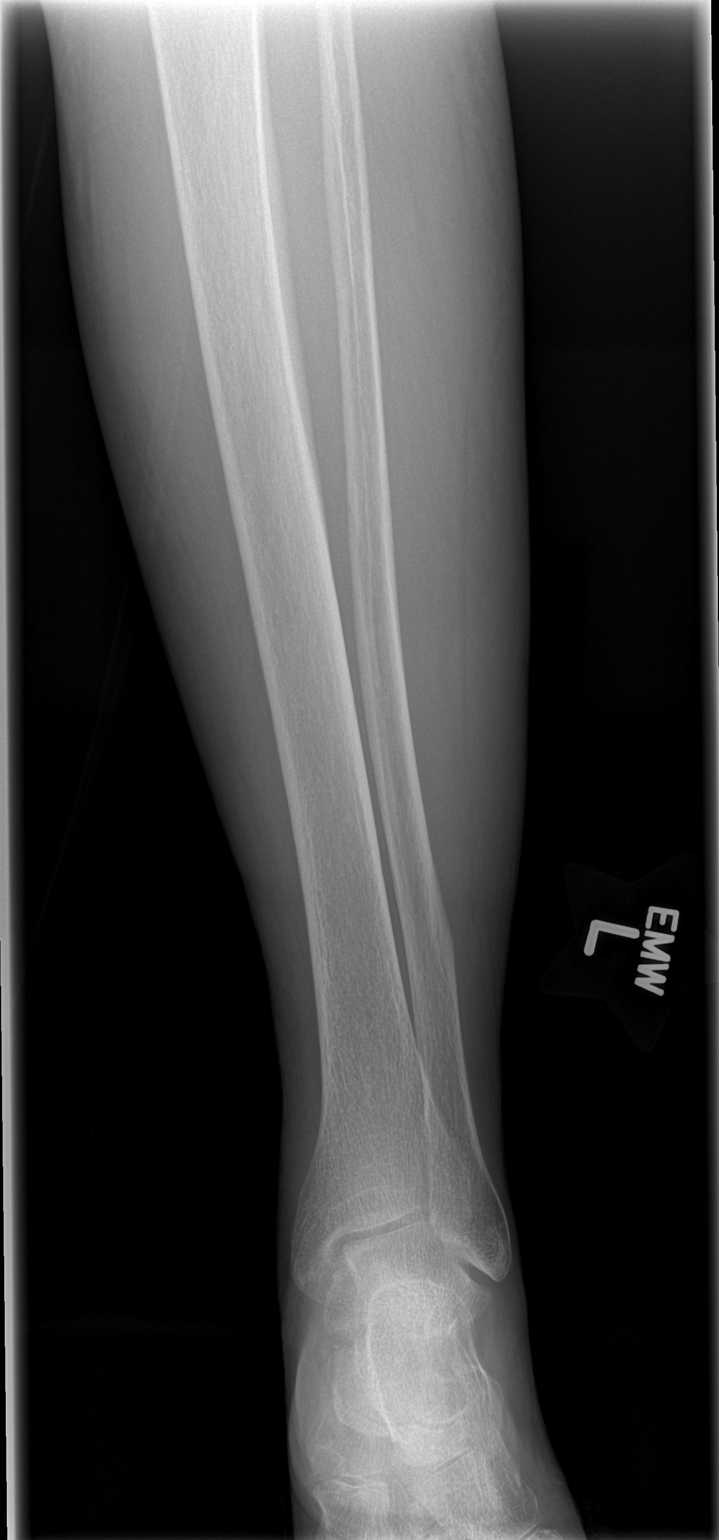

[2 of 2 positions shown; findings below may reference images not displayed]

FINDINGS: Osseous demineralization.
Mild scattered vascular calcification.
Ankle mortise intact.
No acute fracture, dislocation or bone destruction.
No soft tissue gas or radiopaque foreign body.
IMPRESSION: No acute abnormalities.

## 2013-04-12 ENCOUNTER — Observation Stay (HOSPITAL_COMMUNITY)
Admission: EM | Admit: 2013-04-12 | Discharge: 2013-04-13 | Disposition: A | Discharge status: Skilled Nursing Facility | Payer: Medicare Other | Attending: Internal Medicine | Admitting: Internal Medicine

## 2013-04-12 ENCOUNTER — Encounter (HOSPITAL_COMMUNITY): Payer: Self-pay | Admitting: Emergency Medicine

## 2013-04-12 DIAGNOSIS — E039 Hypothyroidism, unspecified: Secondary | ICD-10-CM | POA: Insufficient documentation

## 2013-04-12 DIAGNOSIS — J449 Chronic obstructive pulmonary disease, unspecified: Secondary | ICD-10-CM | POA: Insufficient documentation

## 2013-04-12 DIAGNOSIS — E0590 Thyrotoxicosis, unspecified without thyrotoxic crisis or storm: Secondary | ICD-10-CM

## 2013-04-12 DIAGNOSIS — T148XXA Other injury of unspecified body region, initial encounter: Secondary | ICD-10-CM

## 2013-04-12 DIAGNOSIS — L0390 Cellulitis, unspecified: Secondary | ICD-10-CM

## 2013-04-12 DIAGNOSIS — J4489 Other specified chronic obstructive pulmonary disease: Secondary | ICD-10-CM | POA: Insufficient documentation

## 2013-04-12 DIAGNOSIS — I739 Peripheral vascular disease, unspecified: Secondary | ICD-10-CM | POA: Insufficient documentation

## 2013-04-12 DIAGNOSIS — F0390 Unspecified dementia without behavioral disturbance: Secondary | ICD-10-CM

## 2013-04-12 DIAGNOSIS — E785 Hyperlipidemia, unspecified: Secondary | ICD-10-CM | POA: Insufficient documentation

## 2013-04-12 DIAGNOSIS — IMO0002 Reserved for concepts with insufficient information to code with codable children: Principal | ICD-10-CM | POA: Insufficient documentation

## 2013-04-12 DIAGNOSIS — L02419 Cutaneous abscess of limb, unspecified: Secondary | ICD-10-CM | POA: Insufficient documentation

## 2013-04-12 DIAGNOSIS — I10 Essential (primary) hypertension: Secondary | ICD-10-CM | POA: Insufficient documentation

## 2013-04-12 DIAGNOSIS — L02219 Cutaneous abscess of trunk, unspecified: Secondary | ICD-10-CM | POA: Insufficient documentation

## 2013-04-12 HISTORY — DX: Personal history of other diseases of the digestive system: Z87.19

## 2013-04-12 HISTORY — DX: Anxiety disorder, unspecified: F41.9

## 2013-04-12 HISTORY — DX: Unspecified malignant neoplasm of skin of unspecified part of face: C44.300

## 2013-04-12 HISTORY — DX: Shortness of breath: R06.02

## 2013-04-12 HISTORY — DX: Headache: R51

## 2013-04-12 HISTORY — DX: Pneumonia, unspecified organism: J18.9

## 2013-04-12 HISTORY — DX: Headache, unspecified: R51.9

## 2013-04-12 HISTORY — DX: Chronic kidney disease, stage 4 (severe): N18.4

## 2013-04-12 LAB — CBC WITH DIFFERENTIAL/PLATELET
Basophils Absolute: 0.1 10*3/uL (ref 0.0–0.1)
Eosinophils Absolute: 0.4 10*3/uL (ref 0.0–0.7)
Eosinophils Relative: 4 % (ref 0–5)
Lymphocytes Relative: 16 % (ref 12–46)
MCH: 27.8 pg (ref 26.0–34.0)
MCV: 87.8 fL (ref 78.0–100.0)
Neutrophils Relative %: 66 % (ref 43–77)
Platelets: 201 10*3/uL (ref 150–400)
RBC: 4.25 MIL/uL (ref 3.87–5.11)
RDW: 15.3 % (ref 11.5–15.5)
WBC: 9.9 10*3/uL (ref 4.0–10.5)

## 2013-04-12 LAB — COMPREHENSIVE METABOLIC PANEL
ALT: 12 U/L (ref 0–35)
AST: 24 U/L (ref 0–37)
Alkaline Phosphatase: 69 U/L (ref 39–117)
Calcium: 9.7 mg/dL (ref 8.4–10.5)
Potassium: 4.5 mEq/L (ref 3.5–5.1)
Sodium: 138 mEq/L (ref 135–145)
Total Protein: 7.3 g/dL (ref 6.0–8.3)

## 2013-04-12 MED ORDER — LEVOTHYROXINE SODIUM 75 MCG PO TABS
75.0000 ug | ORAL_TABLET | Freq: Every day | ORAL | Status: DC
  Administered 2013-04-13: 75 ug via ORAL
  Filled 2013-04-12 (×2): qty 1

## 2013-04-12 MED ORDER — ENOXAPARIN SODIUM 30 MG/0.3ML ~~LOC~~ SOLN
30.0000 mg | SUBCUTANEOUS | Status: DC
  Filled 2013-04-12 (×2): qty 0.3

## 2013-04-12 MED ORDER — DONEPEZIL HCL 10 MG PO TABS
10.0000 mg | ORAL_TABLET | Freq: Every day | ORAL | Status: DC
  Administered 2013-04-12: 10 mg via ORAL
  Filled 2013-04-12 (×2): qty 1

## 2013-04-12 MED ORDER — FERROUS GLUCONATE 324 (38 FE) MG PO TABS: 325.0000 mg | ORAL_TABLET | Freq: Every day | ORAL | Status: DC

## 2013-04-12 MED ORDER — VANCOMYCIN HCL IN DEXTROSE 1-5 GM/200ML-% IV SOLN
1000.0000 mg | Freq: Once | INTRAVENOUS | Status: AC
  Administered 2013-04-12: 1000 mg via INTRAVENOUS
  Filled 2013-04-12: qty 200

## 2013-04-12 MED ORDER — SERTRALINE HCL 100 MG PO TABS
100.0000 mg | ORAL_TABLET | Freq: Every day | ORAL | Status: DC
  Administered 2013-04-13: 100 mg via ORAL
  Filled 2013-04-12: qty 1

## 2013-04-12 MED ORDER — AMLODIPINE BESYLATE 10 MG PO TABS
10.0000 mg | ORAL_TABLET | Freq: Every day | ORAL | Status: DC
  Administered 2013-04-13: 10 mg via ORAL
  Filled 2013-04-12: qty 1

## 2013-04-12 MED ORDER — LORAZEPAM 1 MG PO TABS
0.5000 mg | ORAL_TABLET | Freq: Once | ORAL | Status: AC
  Administered 2013-04-12: 0.5 mg via ORAL
  Filled 2013-04-12: qty 1

## 2013-04-12 MED ORDER — VITAMIN D3 25 MCG (1000 UNIT) PO TABS
1000.0000 [IU] | ORAL_TABLET | Freq: Every day | ORAL | Status: DC
  Administered 2013-04-13: 1000 [IU] via ORAL
  Filled 2013-04-12: qty 1

## 2013-04-12 MED ORDER — SODIUM CHLORIDE 0.9 % IV BOLUS (SEPSIS)
1000.0000 mL | Freq: Once | INTRAVENOUS | Status: AC
  Administered 2013-04-12: 1000 mL via INTRAVENOUS

## 2013-04-12 MED ORDER — FERROUS GLUCONATE 324 (38 FE) MG PO TABS
324.0000 mg | ORAL_TABLET | Freq: Every day | ORAL | Status: DC
  Administered 2013-04-13: 324 mg via ORAL
  Filled 2013-04-12 (×3): qty 1

## 2013-04-12 MED ORDER — LORAZEPAM 0.5 MG PO TABS
0.5000 mg | ORAL_TABLET | Freq: Four times a day (QID) | ORAL | Status: DC | PRN
  Administered 2013-04-12: 0.5 mg via ORAL
  Filled 2013-04-12 (×2): qty 1

## 2013-04-12 MED ORDER — SACCHAROMYCES BOULARDII 250 MG PO CAPS
250.0000 mg | ORAL_CAPSULE | Freq: Every day | ORAL | Status: DC
  Administered 2013-04-12 – 2013-04-13 (×2): 250 mg via ORAL
  Filled 2013-04-12 (×2): qty 1

## 2013-04-12 MED ORDER — ASPIRIN 81 MG PO TABS: 81.0000 mg | ORAL_TABLET | Freq: Every day | ORAL | Status: DC

## 2013-04-12 MED ORDER — LACTOBACILLUS RHAMNOSUS (GG) 10 B CELL PO CAPS
1.0000 | ORAL_CAPSULE | Freq: Every day | ORAL | Status: DC
Start: 2013-04-12 — End: 2013-04-12

## 2013-04-12 MED ORDER — ASPIRIN EC 81 MG PO TBEC
81.0000 mg | DELAYED_RELEASE_TABLET | Freq: Every day | ORAL | Status: DC
  Administered 2013-04-12 – 2013-04-13 (×2): 81 mg via ORAL
  Filled 2013-04-12 (×2): qty 1

## 2013-04-12 NOTE — ED Notes (Signed)
Pt clothes placed in a belonging bag (2) shoes in one clothes in another

## 2013-04-12 NOTE — ED Provider Notes (Signed)
CSN: [id number]     Arrival date & time 04/12/13  1259 History   First MD Initiated Contact with Patient 04/12/13 1305     Chief Complaint  Patient presents with  . Cellulitis   (Consider location/radiation/quality/duration/timing/severity/associated sxs/prior Treatment) The history is provided by the patient. No language interpreter was used.  Claudia [name] is an 79 year old female with past medical history peripheral vascular disease, COPD, hyperlipidemia, hypertension, dementia, away presenting to emergency department with skin infection that has been ongoing for numerous months. Patient is currently in [facility] nursing home. Patient presents with daughter, who is patient's power of attorney, as per daughter patient has been having these sores localized to bilateral legs have been ongoing for the past couple months. Daughter reported that the ankle on front of her legs bilaterally have been having these sores have been ongoing for months, patient is been seen by wound care and home health clinics that have been coming to the nursing home for cleaning and proper hygiene. Daughter reported that patient has developed sores to the elbow and arm of the right side along with right hip sores with a crusty appearance that started approximately a couple weeks ago. As per daughter there has been drainage noted, described as a greenish discoloration. Daughter reported that patient was seen by physician just the other day recommended to come in and get IV antibiotics administered. Patient reported that these lesions are rather prudent, continuously throughout the day. Denied pain, fever, chills, chest pain, shortness of breath, difficulty breathing, weakness, nausea, vomiting, diarrhea, back pain, neck pain, changes appetite, changes to appetite. PCP Dr. Drue Novel -as per daughter patient has not seen Dr. Drue Novel in many years  Past Medical History  Diagnosis Date  . COPD (chronic obstructive pulmonary disease)   .  Hyperlipidemia   . Hypertension   . Dementia   . Osteopenia     last dexa aprox 2008- per daughter  . Depression   . Peripheral vascular disease     Diminished circulation in both legs  . Pneumonia     "not in awhile" (04/12/2013)  . Exertional shortness of breath   . Hypothyroidism   . History of stomach ulcers 1990's  . Sinus headache     "monthly or less" (04/12/2013)  . Stroke     "she's had many TIAs in the past"  (04/12/2013)  . Osteoarthritis     "back and joints" (04/12/2013)  . Anxiety   . Renal insufficiency   . Chronic kidney disease (CKD), stage IV (severe)     "her numbers have been consistently stable for years and years" (04/12/2013)  . Breast cancer   . Skin cancer of face     "some cut off; some burned off" (04/12/2013)   Past Surgical History  Procedure Laterality Date  . Appendectomy    . Tonsillectomy    . Hip pinning,cannulated  10/28/2011    Procedure: CANNULATED HIP PINNING;  Surgeon: Mable Paris, MD;  Location: [facility] OR;  Service: Orthopedics;  Laterality: Left;  . Cataract extraction w/ intraocular lens  implant, bilateral Bilateral 1990's  . Refractive surgery Bilateral 1990's    "the kind that sometimes follows cataract OR" (04/12/2013)  . Skin cancer excision  1990's    "face" (04/12/2013)  . Breast lumpectomy Right ~ 2006  . Abdominal hysterectomy  ~ 1974    partial  . Bilateral oophorectomy Bilateral 2000's    "couple years apart" (04/12/2013)   History reviewed. No pertinent family history. History  Substance Use Topics  . Smoking status: Former Smoker -- 1.00 packs/day for 50 years    Types: Cigarettes  . Smokeless tobacco: Never Used     Comment: 04/12/2013 "probably stopped smoking early 2000's"  . Alcohol Use: Yes     Comment: 04/12/2013 "no alcohol in 10-12 yr"   OB History   Grav Para Term Preterm Abortions TAB SAB Ect Mult Living                 Review of Systems  Constitutional: Negative for fever and chills.  HENT: Negative for  neck pain and neck stiffness.   Eyes: Negative for visual disturbance.  Respiratory: Negative for chest tightness and shortness of breath.   Cardiovascular: Negative for chest pain.  Gastrointestinal: Negative for nausea, vomiting, abdominal pain and diarrhea.  Genitourinary: Negative for dysuria, decreased urine volume and difficulty urinating.  Skin: Positive for wound (Scattered sores over upper and lower extremities).  Neurological: Negative for dizziness, weakness and numbness.  All other systems reviewed and are negative.    Allergies  Amoxicillin-pot clavulanate and Sulfonamide derivatives  Home Medications   No current outpatient prescriptions on file. BP 138/56  Pulse 80  Temp(Src) 99.4 F (37.4 C) (Oral)  Resp 16  Ht 5\' 5"  (1.651 m)  Wt 138 lb (62.596 kg)  BMI 22.96 kg/m2  SpO2 93% Physical Exam  Nursing note and vitals reviewed. Constitutional: She is oriented to person, place, and time. She appears well-developed and well-nourished. No distress.  HENT:  Head: Normocephalic and atraumatic.  Mouth/Throat: Oropharynx is clear and moist. No oropharyngeal exudate.  Eyes: Conjunctivae and EOM are normal. Pupils are equal, round, and reactive to light. Right eye exhibits no discharge. Left eye exhibits no discharge.  Neck: Normal range of motion. Neck supple.  Cardiovascular: Normal rate, regular rhythm and normal heart sounds.  Exam reveals no friction rub.   No murmur heard. Pulses:      Radial pulses are 2+ on the right side, and 2+ on the left side.       Dorsalis pedis pulses are 2+ on the right side, and 2+ on the left side.  Pulmonary/Chest: Effort normal and breath sounds normal. No respiratory distress. She has no wheezes. She has no rales.  Lymphadenopathy:    She has no cervical adenopathy.  Neurological: She is alert and oriented to person, place, and time. She exhibits normal muscle tone. Coordination normal.  Strength 5+/5+ with resistance to upper and  lower extremities bilaterally, equal distribution noted.  Sensation intact to upper and lower extremities bilaterally with differentiation to sharp and dull touch  Skin: Skin is warm. She is not diaphoretic.  Scattered areas of sores noted to the bicep region of the right arm, right olecranon process, lateral aspect of right thigh, circumferential aspects of the lower portion of lower extremities I laterally- localized near ankles. Appear to be look like blisters that has sloughed off, erythematous base. Mild yellowish crust surrounding the borders of the erythematous sores. Superficial - negative necrotic tissue noted. Drainage of clear, yellowish fluid noted. Pain upon palpation. Negative surround cellulitic infection identified. Mild odor identified.   Psychiatric: She has a normal mood and affect. Her behavior is normal. Thought content normal.    ED Course  Procedures (including critical care time)  3:15 PM this provider spoke with Dr. Mellody Life. Katrinka Blazing, provider that assesses and is currently seeing patient at [facility]. As per physician, physician reported that this is a new patient of  hers, reported that the PA normally follows this patient. This issue reported that the lower extremity sores have been ongoing for many months. Physician reported that the upper extremity sores are something new. As per physician, these lesions were at first bolus and eventually sloughed off leaving erythematous superficial region. Physician concerns do to numerous rounds of doxycycline and that the lesions are not healing properly. Physician reported that patient has not seen her vascular physician within a year at least, physician is concerned that these lesions can be do to poor circulation to the lower extremities. Physician recommended that patient be admitted to the hospital for a vascular workup as well as internal medicine to be on board regarding reoccurrence of these lesions to the body. Physicians seen  patient on 04/10/2013, recommended Dalbavancin which is not currently stored in the hospital-physician recommended an alternative to the vancomycin. Physician reported the patient had a fever of 96F on 04/10/2013, physician reported that she is concerned of patient's status.   4:22 PM Spoke with Dr. Dorise Hiss, Internal Medicine Teaching Services, since patient has not seen provider for more than over a year. Discussed case with Dr. Dorise Hiss - Dr. Dorise Hiss to see patient and admit patient.  4:40 PM Spoke with family and daughter regarding admission to the hospital due to chronic cellulitis and new onset of sores. Patient was not to happy about admission, but daughter calmed patient down.    Labs Review Labs Reviewed  CBC WITH DIFFERENTIAL - Abnormal; Notable for the following:    Hemoglobin 11.8 (*)    Monocytes Relative 14 (*)    Monocytes Absolute 1.4 (*)    All other components within normal limits  COMPREHENSIVE METABOLIC PANEL - Abnormal; Notable for the following:    Creatinine, Ser 1.33 (*)    Albumin 3.0 (*)    Total Bilirubin 0.2 (*)    GFR calc non Af Amer 35 (*)    GFR calc Af Amer 40 (*)    All other components within normal limits  MRSA PCR SCREENING   Imaging Review No results found.  MDM   1. Cellulitis   2. PVD (peripheral vascular disease)   3. HTN (hypertension)   4. HLD (hyperlipidemia)   5. Dementia     Patient presenting to emergency department with chronic cellulitis and new onset of sores to the upper extremities and lateral aspect of the right thigh. Patient is currently in a nursing home secondary due to dementia, is seen by physicians in our on call and followup in the nursing home. As per daughter, reported that the source of gotten worse and are continuously spreading. Patient is seen by wound care clinic and physicians recommending clean the wounds every other day. Was recommended to come to emergency department to get IV antibiotics by physician. Alert and  oriented. Lungs clear to auscultation bilaterally to upper and lower lobes. Heart rate and rhythm normal. Blisters with soft off superficial skin identified to the upper and lower extremities-right biceps, right olecranon process, circumferential to the lower aspects of the left and right leg. Drainage identified of a yellow, greenish discoloration with a mild foul odor. Lesions appear to look like blisters and a soft off the top of the skin, loose skin identified around the surrounding borders-borders have yellowish crusts similar to impetigo appearance. Weeping identified of clear fluid. Pain upon palpation to the sites. Mild erythema in a located circumferential to the left ankle with negative warmth upon palpation or pain. Full range of motion to  the lower extremities bilaterally. Strength intact bilaterally. Sensation intact. Pulses palpable. Spoke with Dr. Oswald Hillock. Katrinka Blazing, physician palpation saw on 04/10/2013, discussed case and recommendations-recommended that patient be admitted to the hospital regarding chronic cellulitis with ongoing sores they're not properly healing-recommended IV vancomycin and fluids. IV vancomycin and fluids started in ED setting. Spoke with internal medicine teaching services, patient was admitted to internal medicine, placed in observation on MedSurg level for wound center not properly healing. Suspicion of poor wound healing secondary to poor circulation-suspicion for peripheral arterial disease possibility.   Raymon Mutton, PA-C 04/13/13 1042

## 2013-04-12 NOTE — ED Notes (Signed)
Pt does have a book

## 2013-04-12 NOTE — ED Notes (Signed)
Pts daughter at bedside  

## 2013-04-12 NOTE — ED Notes (Signed)
Spoke with Harriett Sine, RN on [employer].  Room 21 is being cleaned right now.  Environmental says it will be about 35 minutes before the room is ready.

## 2013-04-12 NOTE — ED Notes (Signed)
Pt from [facility] by ems for eval of sores/lesions on back, arms and legs.  Some might be cellulitis per staff at [facility]. Hx dementia. A&o x1. Polite and cooperative.

## 2013-04-12 NOTE — H&P (Addendum)
Date: 04/12/2013               Patient Name:  Claudia [name] MRN: [id number]  DOB: Aug 04, 1923 Age / Sex: 79 y.o., female   PCP: Provider Default, MD         Medical Service: Internal Medicine Teaching Service         Attending Physician: Dr. Bonnita Levan. Bernette Mayers, MD    First Contact: Dr. Vivi Barrack Pager: [phone]  Second Contact: Dr. Suszanne Conners Pager: [phone]       After Hours (After 5p/  First Contact Pager: [phone]  weekends / holidays): Second Contact Pager: [phone]   Chief Complaint: Skin lesions  History of Present Illness:  Claudia [name] is a 79 y.o. female PMH peripheral vascular disease, COPD, hyperlipidemia, hypertension, hypothyroidism, dementia who presents with multiple skin lesions that have been present for several months. Her daughter was present to assist with the history.   She reports sores on her bilateral anterior ankles for months. She has also has had sores on her right elbow, hip and thigh for several weeks. She has "very sensitive skin". The patient lives in [facility] nursing home where she has received regular wound care. She has not seen a dermatologist. Her daughter was present for a dressing change recently and was surprised to note the sores on her ankle had become more red, and there was a "greenish pus" from the right ankle wound. The daughter had not seen her mother's wounds undressed in a long time. A physician was consulted who recommended IV antibiotics, which apparently could not be done safely at the nursing home, so they presented here. Denies pain, fever, chills, chest pain, shortness of breath, difficulty breathing, weakness, nausea, vomiting, diarrhea, back pain, neck pain, calf tenderness, changes to appetite. She does complain that her wounds are sometimes itchy.  Her nursing home physician was called by the ED provider. She states the lesions were bullae at first, then sloughed. Several courses of Doxycycline were given without healing.  Apparently patient had not seen her vascular physician within a year at least, so her doctor expressed concern that these lesions were due to poor circulation. She also reports the patient had a fever of 79F at her most recent visit on 04/10/2013.  In the ED the patient was given IV vancomycin and a 1L NS bolus.   Meds: No current facility-administered medications for this encounter.   Current Outpatient Prescriptions  Medication Sig Dispense Refill  . acetaminophen (TYLENOL) 500 MG tablet Take 500 mg by mouth every 6 (six) hours as needed for pain.      Marland Kitchen amLODipine (NORVASC) 10 MG tablet Take 10 mg by mouth daily.      Marland Kitchen aspirin 81 MG tablet Take 81 mg by mouth every other day.       . busPIRone (BUSPAR) 10 MG tablet Take 10 mg by mouth 2 (two) times daily.      . Calcium-Magnesium-Vitamin D (CALCIUM 500 PO) Take by mouth.      . cholecalciferol (VITAMIN D) 1000 UNITS tablet Take 1,000 Units by mouth daily.      Marland Kitchen donepezil (ARICEPT) 10 MG tablet Take 10 mg by mouth at bedtime.        . ferrous gluconate (FERGON) 325 MG tablet Take 325 mg by mouth daily with breakfast.      . lactobacillus rhamnosus, GG, (CULTURELLE) 10 B CELL capsule Take 1 capsule by mouth daily.        Marland Kitchen  levothyroxine (SYNTHROID, LEVOTHROID) 75 MCG tablet Take 75 mcg by mouth daily before breakfast.      . LORazepam (ATIVAN) 0.5 MG tablet Take 0.5 mg by mouth every 6 (six) hours as needed for anxiety.      . Multiple Vitamins-Minerals (CENTRUM ULTRA WOMENS PO) Take by mouth.        . sertraline (ZOLOFT) 100 MG tablet Take 100 mg by mouth daily.      . cetirizine (ZYRTEC) 10 MG tablet Take 10 mg by mouth daily as needed for allergies.       . Loperamide-Simethicone (IMODIUM ADVANCED) 2-125 MG TABS Take 1 tablet by mouth as needed (loose stoole). No more than 3/day      . traMADol (ULTRAM) 50 MG tablet Take 50 mg by mouth every 6 (six) hours as needed for pain. Patient is given this medication for pain.         Allergies: Allergies as of 04/12/2013 - Review Complete 04/12/2013  Allergen Reaction Noted  . Amoxicillin-pot clavulanate  01/17/2007  . Sulfonamide derivatives  08/11/2009   Past Medical History  Diagnosis Date  . COPD (chronic obstructive pulmonary disease)   . Hyperlipidemia   . Hypertension   . Dementia   . Renal insufficiency   . Breast cancer   . Hypothyroidism   . Osteopenia     last dexa aprox 2008- per daughter  . Osteoarthritis   . Depression   . Stroke     Multiple TIAs in the past   . Peripheral vascular disease     Diminished circulation in both legs   Past Surgical History  Procedure Laterality Date  . Appendectomy    . Abdominal hysterectomy    . Tonsillectomy    . Hip pinning,cannulated  10/28/2011    Procedure: CANNULATED HIP PINNING;  Surgeon: Mable Paris, MD;  Location: [facility] OR;  Service: Orthopedics;  Laterality: Left;   No family history on file. History   Social History  . Marital Status: Widowed    Spouse Name: N/A    Number of Children: N/A  . Years of Education: N/A   Occupational History  . Not on file.   Social History Main Topics  . Smoking status: Former Smoker    Types: Cigarettes  . Smokeless tobacco: Not on file  . Alcohol Use: Yes     Comment: socially  . Drug Use: No  . Sexual Activity: Not Currently    Birth Control/ Protection: None   Other Topics Concern  . Not on file   Social History Narrative   Moved back to [place] 12/2008   Lives at [facility] (ALF)          Review of Systems: Pertinent items are noted in HPI.  Physical Exam: Blood pressure 146/63, pulse 70, temperature 97.9 F (36.6 C), temperature source Oral, resp. rate 16, SpO2 98.00%. Physical Exam  Constitutional: She is well-developed, well-nourished, and in no distress.  HENT:  Head: Normocephalic and atraumatic.  Cardiovascular: Normal rate, regular rhythm, normal heart sounds and intact distal pulses.  Exam reveals no gallop  and no friction rub.   No murmur heard. No LE edema.  Pulmonary/Chest: Effort normal and breath sounds normal. No respiratory distress. She has no wheezes. She has no rales. She exhibits no tenderness.  Abdominal: Soft. There is no tenderness.  Musculoskeletal: Normal range of motion. She exhibits no edema and no tenderness.  No calf tenderness.  Neurological: She is alert.  Oriented to self and  place, but not date. Calf fasciculations noted bilaterally.  Skin: Skin is warm and dry. Lesion noted. She is not diaphoretic. No pallor.     Thorough skin evaluation completed.  Multiple areas of skin sloughing/tearing with exposed epidermis in bicep region of the right arm/right olecranon process, lateral aspect of right thigh, bilaterally lower extremities (noted in image above with approximate sizes). The lower extremity skin tears are multiple, associated with some erythematous skin changes, L>R, per the patient this is chronic but perhaps worsening. Mild drainage of clear, yellowish fluid noted. Areas are not painful, purulent, or swollen. No signs of necrosis. Her DP pulses are 1+ bilaterally.  She also has a healed vs. stage 1 sacral ulcer. No skin breakdown. She also has small macular rash over left inguinal area with satellite lesions, yeast-like odor noted.  Psychiatric: Mood and affect normal.     Lab results: Basic Metabolic Panel:  Recent Labs  46/96/29 1409  NA 138  K 4.5  CL 102  CO2 25  GLUCOSE 93  BUN 19  CREATININE 1.33*  CALCIUM 9.7   Liver Function Tests:  Recent Labs  04/12/13 1409  AST 24  ALT 12  ALKPHOS 69  BILITOT 0.2*  PROT 7.3  ALBUMIN 3.0*   CBC:  Recent Labs  04/12/13 1409  WBC 9.9  NEUTROABS 6.5  HGB 11.8*  HCT 37.3  MCV 87.8  PLT 201    Assessment & Plan by Problem: Claudia [name] is a 79 y.o. female PMH peripheral vascular disease, COPD, hyperlipidemia, hypertension, hypothyroidism, dementia who presents with multiple skin lesions  that have been present for several months.  #Delayed wound healing - Pt presents with multiple skin lesions. There are no clinical signs or symptoms of cellulitis. She is afebrile and her vital signs are stable. Her CBC shows a WBC of 9.9, her baseline is 8-9. No left shift noted.  - Admit to IMTS - Consult to wound care - Holding all tylenol-containing medications in case we unmask fever - Monitor vital signs - Will recommend outpatient vascular evaluation, dermatology evaluation  #HTN - BP currently well controlled, 130s/60s - Continue home norvasc, asa  #Dementia - Per daughter, patient's mental status is at baseline. Oriented to place and name. Stable. - Continue home aricept  #Iron deficiency anemia - Stable - Continue home iron supplementation  #Lactose intolerance - Hgb 11.8, improved from prior readings (10.9, 11.0) - Continue home culturelle - Lactose free diet  #Hypothyroidism - Stable - Continue home synthroid  #Anxiety/depression - Stable. - Will continue home ativan prn, zoloft - Holding home buspar, tramadol to avoid oversedation  #DVT PPX - Subq lovenox  Dispo: Disposition is deferred at this time, awaiting improvement of current medical problems. Anticipated discharge in approximately 1-3 day(s).   The patient does have a current PCP (Provider Default, MD) and does need an [facility] hospital follow-up appointment after discharge.  The patient does not have transportation limitations that hinder transportation to clinic appointments.  Signed: Vivi Barrack, MD 04/12/2013, 6:54 PM

## 2013-04-13 DIAGNOSIS — B372 Candidiasis of skin and nail: Secondary | ICD-10-CM

## 2013-04-13 DIAGNOSIS — IMO0002 Reserved for concepts with insufficient information to code with codable children: Secondary | ICD-10-CM

## 2013-04-13 LAB — MRSA PCR SCREENING: MRSA by PCR: NEGATIVE

## 2013-04-13 MED ORDER — TRAMADOL HCL 50 MG PO TABS: 50.0000 mg | ORAL_TABLET | Freq: Four times a day (QID) | ORAL | Status: AC | PRN

## 2013-04-13 MED ORDER — LORAZEPAM 0.5 MG PO TABS: 0.5000 mg | ORAL_TABLET | Freq: Four times a day (QID) | ORAL | Status: DC | PRN

## 2013-04-13 MED ORDER — LORAZEPAM 2 MG/ML IJ SOLN
INTRAMUSCULAR | Status: AC
  Administered 2013-04-13: 0.25 mg via INTRAVENOUS
  Filled 2013-04-13: qty 1

## 2013-04-13 MED ORDER — NYSTATIN 100000 UNIT/GM EX POWD: 1.0000 | Freq: Three times a day (TID) | CUTANEOUS | Status: AC

## 2013-04-13 MED ORDER — LORAZEPAM 2 MG/ML IJ SOLN
0.2500 mg | Freq: Once | INTRAMUSCULAR | Status: AC
  Administered 2013-04-13: 0.25 mg via INTRAVENOUS

## 2013-04-13 MED ORDER — NYSTATIN 100000 UNIT/GM EX POWD
Freq: Three times a day (TID) | CUTANEOUS | Status: DC
  Filled 2013-04-13: qty 15

## 2013-04-13 NOTE — Clinical Social Work Note (Signed)
Clinical Social Worker received call back from [facility] and they will be able to receive patient today. CSW will notify MD. CSW will facilitate discharge and complete discharge packet and place in shadow chart, once d/c summary is completed.   Rozetta Nunnery MSW, [employer] [phone]

## 2013-04-13 NOTE — Progress Notes (Signed)
S: Called by nurse requesting IV ativan as patient refusing PO ativan. Saw patient who stated that she doesn't need the medicine. She was easily aggravated. Daughter in room who seemed to be a negative influence on the patients mood. Nurse and family are concerned about the patient trying to get out of bed.  O: VSS. Patient is easily redirectable.  A+P: Did not give IV ativan. Ordered sitter at bedside for falls prevention.

## 2013-04-13 NOTE — Consult Note (Signed)
WOC consult Note Reason for Consult: multiple skin tears, however upon inspection these do not appear to be skin tears. She has partial thickness skin ulcers over her right medial upper arm, right lateral flank, left lateral thoracic area, bilateral groin (small and only 1-2 per groin, right lateral upper thigh, bilateral medial inner calf, lateral left calf, medial left knee.  These areas are not in a pattern over the body, not related to onset of new medications or foods.  Daughter reports they started when she entered assisted living facilities 10 years ago.  Pt reports sensitive skin to detergents, and the daughter brings non allergenic laundry detergent into the facility but is not clear if they always laundry her clothes and linens with this.  These lesions are not pruritic.  She does have some exudate but mainly from the LE wounds, it is yellow-brown but not purulent, no odor.    Wound type:dermatologic condition, unclear etiology. Outside of the scope of the WOC nurse.  I have recommend dermatology consult on outpatient basis  Measurement: most are small areas 2x2 cm, the largest ones are on her LE (4x6, 5x6) and all are only partial thickness skin loss. Wound bed: pink, moist, non necrotic Drainage (amount, consistency, odor) minimal to none from most sites, however the LE wounds have a bit more. Periwound: intact, she does have mild cellulitis of the left LE.  Dressing procedure/placement/frequency: silicone foam dressings for the lesions for now, this will insulate, protect, and manage exudate from the LE wounds. It is also a very gentle dressing that will allow for assessment without changing the dressing. Dressing to be changed every 3 days and PRN soilage.    Can obtain silicone foam dressings (Allevyn-brand name) or any other silicone foam dressing from [facility].  If facility can not provide until dermatology consult can be completed on the outpatient basis.    Discussed POC with patient and bedside nurse.  Re consult if needed, will not follow at this time. Thanks  Tanya Crothers [employer], CWOCN [phone]):

## 2013-04-13 NOTE — H&P (Signed)
I saw and evaluated the patient. I personally confirmed the key portions of Dr. Doristine Locks history and exam and reviewed pertinent patient test results. The assessment, diagnosis, and plan were reviewed with the housestaff and I agree with the documentation in the resident's note.  [name] does not have a cellulitis on examination.  The lesions are very superficial and appear to be secondary to tearing of the skin (epidermis).  Meticulous wound care is likely the best therapy available and careful care of the skin from excessive pressure or shearing, along with good nutrition, is likely the best available preventative measures.  I agree with getting input from wound care on any special dressings or therapies.  [name] is stable for return to the nursing home today, which is important given her increased agitation yesterday in an unfamiliar setting.

## 2013-04-13 NOTE — ED Provider Notes (Signed)
Medical screening examination/treatment/procedure(s) were conducted as a shared visit with non-physician practitioner(s) and myself.  I personally evaluated the patient during the encounter  Pt with chronic non-healing wounds on arms and legs, some ongoing for months, not improved with treatment at SNF. Sent by PCP for admission and consideration of IV abx vs vascular workup. No signs of sepsis.  Charles B. Bernette Mayers, MD 04/13/13 Ernestina [place]

## 2013-04-13 NOTE — Clinical Social Work Note (Addendum)
Clinical Social Worker facilitated discharge by contacting facility and left a voice message for daughter. CSW will complete discharge packet and place with shadow chart. Patient will be transported via ambulance. CSW will sign off, as social work intervention is no longer needed.   Ambulance reference #- [id number]  Rozetta Nunnery MSW, [place] [phone]

## 2013-04-13 NOTE — Progress Notes (Signed)
Pt admitted from ED to room 6N21 via stretcher accompanied by daughter,pt alert and oriented x2,oriented to room and cal bell.

## 2013-04-13 NOTE — Progress Notes (Signed)
Subjective: Patient seen at bedside. She was somewhat agitated overnight after her daughter left, but consolable. The night team did order a sitter. This morning her daughter was present and she was calm. Denies pain, fever, chills, chest pain, shortness of breath, difficulty breathing, weakness, nausea, vomiting, diarrhea, back pain, neck pain, calf tenderness, changes to appetite.  Objective: Vital signs in last 24 hours: Filed Vitals:   04/12/13 1315 04/12/13 1345 04/12/13 2103 04/13/13 0655  BP: 134/67 146/63 138/91 138/56  Pulse: 70 70 87 80  Temp:   98.4 F (36.9 C) 99.4 F (37.4 C)  TempSrc:   Oral Oral  Resp:   18 16  Height:   5\' 5"  (1.651 m)   Weight:   138 lb (62.596 kg)   SpO2: 96% 98% 94% 93%   Weight change:  No intake or output data in the 24 hours ending 04/13/13 1327  Physical Exam  Constitutional: She is well-developed, well-nourished, and in no distress.  HENT:  Head: Normocephalic and atraumatic.  Cardiovascular: Normal rate, regular rhythm, normal heart sounds and intact distal pulses. Exam reveals no gallop and no friction rub.  No murmur heard. No LE edema.  Pulmonary/Chest: Effort normal and breath sounds normal. No respiratory distress. She has no wheezes. She has no rales. She exhibits no tenderness.  Abdominal: Soft. There is no tenderness.  Musculoskeletal: Normal range of motion. She exhibits no edema and no tenderness.  No calf tenderness.  Neurological: She is alert.  Oriented to self and place, but not date. Calf fasciculations noted bilaterally.  Skin: Skin is warm and dry. Lesion noted. She is not diaphoretic. No pallor.    Thorough skin evaluation completed.  Multiple areas of skin sloughing/tearing with exposed epidermis in bicep region of the right arm/right olecranon process, lateral aspect of right thigh, bilaterally lower extremities (noted in image above with approximate sizes). The lower extremity skin tears are multiple, associated  with some erythematous skin changes, L>R, per the patient this is chronic but perhaps worsening. Mild drainage of clear, yellowish fluid noted. Areas are not painful, purulent, or swollen. No signs of necrosis. Her DP pulses are 1+ bilaterally.  She also has a healed vs. stage 1 sacral ulcer. No skin breakdown. She also has small macular rash over left inguinal area with satellite lesions, yeast-like odor noted.  Psychiatric: Mood and affect normal.   Lab Results: Basic Metabolic Panel:  Recent Labs Lab 04/12/13 1409  NA 138  K 4.5  CL 102  CO2 25  GLUCOSE 93  BUN 19  CREATININE 1.33*  CALCIUM 9.7   Liver Function Tests:  Recent Labs Lab 04/12/13 1409  AST 24  ALT 12  ALKPHOS 69  BILITOT 0.2*  PROT 7.3  ALBUMIN 3.0*   CBC:  Recent Labs Lab 04/12/13 1409  WBC 9.9  NEUTROABS 6.5  HGB 11.8*  HCT 37.3  MCV 87.8  PLT 201   Micro Results: Recent Results (from the past 240 hour(s))  MRSA PCR SCREENING     Status: None   Collection Time    04/13/13  1:01 AM      Result Value Range Status   MRSA by PCR NEGATIVE  NEGATIVE Final   Comment:            The GeneXpert MRSA Assay (FDA     approved for NASAL specimens     only), is one component of a     comprehensive MRSA colonization     surveillance program.  It is not     intended to diagnose MRSA     infection nor to guide or     monitor treatment for     MRSA infections.   Medications: I have reviewed the patient's current medications. Scheduled Meds: . amLODipine  10 mg Oral Daily  . aspirin EC  81 mg Oral Daily  . cholecalciferol  1,000 Units Oral Daily  . donepezil  10 mg Oral QHS  . enoxaparin (LOVENOX) injection  30 mg Subcutaneous Q24H  . ferrous gluconate  324 mg Oral Q breakfast  . levothyroxine  75 mcg Oral QAC breakfast  . nystatin   Topical TID  . saccharomyces boulardii  250 mg Oral Daily  . sertraline  100 mg Oral Daily   Continuous Infusions:  PRN Meds:.LORazepam Assessment/Plan: Claudia [name] is a 79 y.o. female PMH peripheral vascular disease, COPD, hyperlipidemia, hypertension, hypothyroidism, dementia who presents with multiple skin lesions that have been present for several months.  #Delayed wound healing - Pt presents with multiple skin lesions. There are no clinical signs or symptoms of cellulitis. She is afebrile and her vital signs are stable. Her CBC shows a WBC of 9.9, her baseline is 8-9. No left shift noted.  - Consult to wound care, appreciate recs - Will recommend outpatient dermatology evaluation  - Medically stable for discharge  #HTN - BP currently well controlled, 130s/60s  - Continue home norvasc, asa   #Dementia - Per daughter, patient's mental status is at baseline. Oriented to place and name. Stable.  - Continue home aricept   #Iron deficiency anemia - Stable  - Continue home iron supplementation   #Lactose intolerance - Hgb 11.8, improved from prior readings (10.9, 11.0)  - Continue home culturelle  - Lactose free diet   #Hypothyroidism - Stable  - Continue home synthroid   #Anxiety/depression - Stable.  - Will continue home ativan prn, zoloft  - Holding home buspar, tramadol to avoid oversedation   #DVT PPX - Subq lovenox  Dispo: Disposition is deferred at this time, awaiting improvement of current medical problems.  Anticipated discharge in approximately 1-3 day(s).   The patient does have a current PCP (Provider Default, MD) and does need an [facility] hospital follow-up appointment after discharge.  The patient does not have transportation limitations that hinder transportation to clinic appointments.  .Services Needed at time of discharge: Y = Yes, Blank = No PT:   OT:   RN:   Equipment:   Other:     LOS: 1 day   Vivi Barrack, MD 04/13/2013, 1:27 PM

## 2013-04-13 NOTE — Progress Notes (Signed)
Attempted to call report to receiving nurse at [facility] x3.  Left message on vm.

## 2013-04-13 NOTE — Discharge Summary (Signed)
Name: Claudia [name] MRN: [id number] DOB: 23-Jul-1923 79 y.o. PCP: Provider Default, MD  Date of Admission: 04/12/2013  1:00 PM Date of Discharge: 04/13/2013 Attending Physician: Rocco Serene, MD  Discharge Diagnosis:  1. Delayed wound healing  2. Candidal intertrigo  Discharge Medications:   Medication List         acetaminophen 500 MG tablet  Commonly known as:  TYLENOL  Take 500 mg by mouth every 6 (six) hours as needed for pain.     amLODipine 10 MG tablet  Commonly known as:  NORVASC  Take 10 mg by mouth daily.     aspirin 81 MG tablet  Take 81 mg by mouth every other day.     busPIRone 10 MG tablet  Commonly known as:  BUSPAR  Take 10 mg by mouth 2 (two) times daily.     CALCIUM 500 PO  Take by mouth.     CENTRUM ULTRA WOMENS PO  Take by mouth.     cetirizine 10 MG tablet  Commonly known as:  ZYRTEC  Take 10 mg by mouth daily as needed for allergies.     cholecalciferol 1000 UNITS tablet  Commonly known as:  VITAMIN D  Take 1,000 Units by mouth daily.     donepezil 10 MG tablet  Commonly known as:  ARICEPT  Take 10 mg by mouth at bedtime.     ferrous gluconate 325 MG tablet  Commonly known as:  FERGON  Take 325 mg by mouth daily with breakfast.     IMODIUM ADVANCED 2-125 MG Tabs  Generic drug:  Loperamide-Simethicone  Take 1 tablet by mouth as needed (loose stoole). No more than 3/day     lactobacillus rhamnosus (GG) 10 B CELL capsule  Commonly known as:  CULTURELLE  Take 1 capsule by mouth daily.     levothyroxine 75 MCG tablet  Commonly known as:  SYNTHROID, LEVOTHROID  Take 75 mcg by mouth daily before breakfast.     LORazepam 0.5 MG tablet  Commonly known as:  ATIVAN  Take 1 tablet (0.5 mg total) by mouth every 6 (six) hours as needed for anxiety.     nystatin 100000 UNIT/GM Powd  Apply 1 Bottle topically 3 (three) times daily. Please apply to candida rash on left inguinal area.     sertraline 100 MG tablet  Commonly known as:   ZOLOFT  Take 100 mg by mouth daily.     traMADol 50 MG tablet  Commonly known as:  ULTRAM  Take 1 tablet (50 mg total) by mouth every 6 (six) hours as needed for pain. Patient is given this medication for pain.        Disposition and follow-up:   Ms.Marche Parkhurst was discharged from [facility] in Good condition.  At the hospital follow up visit please address:  1.  Dermatologic evaluation of partial thickness skin ulcers over her right medial upper arm, right lateral flank, left lateral thoracic area, bilateral groin (small and only 1-2 per groin, right lateral upper thigh, bilateral medial inner calf, lateral left calf, medial left knee   2.  Labs / imaging needed at time of follow-up: None  3.  Pending labs/ test needing follow-up: None  Follow-up Appointments: Follow-up Information   Follow up with Bradd Canary On 05/29/2013. (9:45AM. Please bring your insurance cards and the name and contact number of your doctor at [facility]. )    Contact information:   [facility] [address] [place]  [place] [address] [phone]      Follow up with You doctor at [facility]. (As soon as possible.)       Discharge Instructions: Discharge Orders   Future Orders Complete By Expires   Diet - low sodium heart healthy  As directed    Increase activity slowly  As directed       Consultations:  Wound care nurse  Procedures Performed:  No results found.  Admission HPI:  [name] is a 78 y.o. female PMH peripheral vascular disease, COPD, hyperlipidemia, hypertension, hypothyroidism, dementia who presents with multiple skin lesions that have been present for several months. Her daughter was present to assist with the history. She reports sores on her bilateral anterior ankles for months. She has also has had sores on her right elbow, hip and thigh for several weeks. She has "very sensitive skin". The patient lives in [facility]  where she has received regular wound care. She has not seen a dermatologist. Her daughter was present for a dressing change recently and was surprised to note the sores on her ankle had become more red, and there was a "greenish pus" from the right ankle wound. The daughter had not seen her mother's wounds undressed in a long time. A physician was consulted who recommended IV antibiotics, which apparently could not be done safely at the nursing home, so they presented here. Denies pain, fever, chills, chest pain, shortness of breath, difficulty breathing, weakness, nausea, vomiting, diarrhea, back pain, neck pain, calf tenderness, changes to appetite. She does complain that her wounds are sometimes itchy. Her nursing home physician was called by the ED provider. She states the lesions were bullae at first, then sloughed. Several courses of Doxycycline were given without healing. Apparently patient had not seen her vascular physician within a year at least, so her doctor expressed concern that these lesions were due to poor circulation. She also reports the patient had a fever of 74F at her most recent visit on 04/10/2013.  In the ED the patient was given IV vancomycin and a 1L NS bolus.  Hospital Course by problem list:   1. Delayed wound healing - Pt presented with multiple areas of skin sloughing/tearing with exposed epidermis over right medial upper arm, right lateral flank, left lateral thoracic area, bilateral groin (small and only 1-2 per groin, right lateral upper thigh, bilateral medial inner calf, lateral left calf, medial left knee. These areas are not in a pattern over the body. They are not related to onset of new medications or foods. Daughter reports they started when she entered assisted living facilities 10 years ago. Pt reports sensitive skin to rough fabrics and detergents. The daughter brings non allergenic laundry detergent into the facility, but is not sureif they always wash her clothes  and linens with this. These lesions are mildly pruritic. She does have some exudate mainly from the LE wounds. It is yellow-brown but not purulent, no odor. There were no clinical signs or symptoms of cellulitis. She was afebrile and her vital signs were stable. Her CBC showed a WBC of 9.9, her baseline is 8-9. No left shift noted. Wound care nurse felt these represented partial thickness skin ulcers and recommend dermatology consult on outpatient basis.   2. Candidal intertrigo - She has a macular rash over her left inguinal area with satellite lesions, yeast-like odor noted. Topical Nystatin was prescribed.  Discharge Vitals:   BP 141/58  Pulse 97  Temp(Src) 98.4 F (36.9 C) (  Oral)  Resp 19  Ht 5\' 5"  (1.651 m)  Wt 138 lb (62.596 kg)  BMI 22.96 kg/m2  SpO2 92%  Discharge Labs:  Results for orders placed during the hospital encounter of 04/12/13 (from the past 24 hour(s))  CBC WITH DIFFERENTIAL     Status: Abnormal   Collection Time    04/12/13  2:09 PM      Result Value Range   WBC 9.9  4.0 - 10.5 K/uL   RBC 4.25  3.87 - 5.11 MIL/uL   Hemoglobin 11.8 (*) 12.0 - 15.0 g/dL   HCT 40.9  81.1 - 91.4 %   MCV 87.8  78.0 - 100.0 fL   MCH 27.8  26.0 - 34.0 pg   MCHC 31.6  30.0 - 36.0 g/dL   RDW 78.2  95.6 - 21.3 %   Platelets 201  150 - 400 K/uL   Neutrophils Relative % 66  43 - 77 %   Neutro Abs 6.5  1.7 - 7.7 K/uL   Lymphocytes Relative 16  12 - 46 %   Lymphs Abs 1.5  0.7 - 4.0 K/uL   Monocytes Relative 14 (*) 3 - 12 %   Monocytes Absolute 1.4 (*) 0.1 - 1.0 K/uL   Eosinophils Relative 4  0 - 5 %   Eosinophils Absolute 0.4  0.0 - 0.7 K/uL   Basophils Relative 1  0 - 1 %   Basophils Absolute 0.1  0.0 - 0.1 K/uL  COMPREHENSIVE METABOLIC PANEL     Status: Abnormal   Collection Time    04/12/13  2:09 PM      Result Value Range   Sodium 138  135 - 145 mEq/L   Potassium 4.5  3.5 - 5.1 mEq/L   Chloride 102  96 - 112 mEq/L   CO2 25  19 - 32 mEq/L   Glucose, Bld 93  70 - 99 mg/dL    BUN 19  6 - 23 mg/dL   Creatinine, Ser 0.86 (*) 0.50 - 1.10 mg/dL   Calcium 9.7  8.4 - 57.8 mg/dL   Total Protein 7.3  6.0 - 8.3 g/dL   Albumin 3.0 (*) 3.5 - 5.2 g/dL   AST 24  0 - 37 U/L   ALT 12  0 - 35 U/L   Alkaline Phosphatase 69  39 - 117 U/L   Total Bilirubin 0.2 (*) 0.3 - 1.2 mg/dL   GFR calc non Af Amer 35 (*) >90 mL/min   GFR calc Af Amer 40 (*) >90 mL/min  MRSA PCR SCREENING     Status: None   Collection Time    04/13/13  1:01 AM      Result Value Range   MRSA by PCR NEGATIVE  NEGATIVE    Signed: Vivi Barrack, MD 04/13/2013, 2:00 PM   Time Spent on Discharge: 30 minutes Services Ordered on Discharge: None Equipment Ordered on Discharge: None

## 2013-04-13 NOTE — Clinical Social Work Note (Signed)
Clinical Social Work Department BRIEF PSYCHOSOCIAL ASSESSMENT 04/13/2013  Patient:  Claudia [name],Claudia [name]     Account Number:  [id number]     Admit date:  04/12/2013  Clinical Social Worker:  Hulan Fray  Date/Time:  04/13/2013 09:45 AM  Referred by:  RN  Date Referred:  04/13/2013 Referred for  Other - See comment   Other Referral:   Admit from facility   Interview type:  Family Other interview type:   Daughter- Claudia [name] at bedside    PSYCHOSOCIAL DATA Living Status:  FACILITY Admitted from facility:  [facility] Level of care:  Assisted Living Primary support name:  Claudia [name] Primary support relationship to patient:  CHILD, ADULT Degree of support available:   supportive    CURRENT CONCERNS Current Concerns  Post-Acute Placement   Other Concerns:    SOCIAL WORK ASSESSMENT / PLAN Clinical Social Worker received referral that patient was admitted from facility. CSW introduced self and explained reason for visit. Patient's daughter was at bedside. Daughter reported that patient is from [facility]. CSW will complete FL2 for MD's signature.    CSW called facility and left a message for admissions coordinator to return call.   Assessment/plan status:  Psychosocial Support/Ongoing Assessment of Needs Other assessment/ plan:   Information/referral to community resources:   Patient is from facility    PATIENT'S/FAMILY'S RESPONSE TO PLAN OF CARE: Daughter reported that patient is from [facility].

## 2014-02-04 ENCOUNTER — Encounter (HOSPITAL_BASED_OUTPATIENT_CLINIC_OR_DEPARTMENT_OTHER): Payer: Self-pay | Admitting: Emergency Medicine

## 2014-02-04 ENCOUNTER — Emergency Department (HOSPITAL_BASED_OUTPATIENT_CLINIC_OR_DEPARTMENT_OTHER)
Admission: EM | Admit: 2014-02-04 | Discharge: 2014-02-04 | Disposition: A | Discharge status: Home / Self Care | Payer: Medicare Other | Attending: Emergency Medicine | Admitting: Emergency Medicine

## 2014-02-04 ENCOUNTER — Emergency Department (HOSPITAL_BASED_OUTPATIENT_CLINIC_OR_DEPARTMENT_OTHER): Payer: Medicare Other

## 2014-02-04 DIAGNOSIS — F0390 Unspecified dementia without behavioral disturbance: Secondary | ICD-10-CM | POA: Insufficient documentation

## 2014-02-04 DIAGNOSIS — F3289 Other specified depressive episodes: Secondary | ICD-10-CM | POA: Insufficient documentation

## 2014-02-04 DIAGNOSIS — Z87891 Personal history of nicotine dependence: Secondary | ICD-10-CM | POA: Insufficient documentation

## 2014-02-04 DIAGNOSIS — Z8701 Personal history of pneumonia (recurrent): Secondary | ICD-10-CM | POA: Insufficient documentation

## 2014-02-04 DIAGNOSIS — Z8719 Personal history of other diseases of the digestive system: Secondary | ICD-10-CM | POA: Insufficient documentation

## 2014-02-04 DIAGNOSIS — Z8673 Personal history of transient ischemic attack (TIA), and cerebral infarction without residual deficits: Secondary | ICD-10-CM | POA: Insufficient documentation

## 2014-02-04 DIAGNOSIS — IMO0002 Reserved for concepts with insufficient information to code with codable children: Secondary | ICD-10-CM

## 2014-02-04 DIAGNOSIS — S51009A Unspecified open wound of unspecified elbow, initial encounter: Secondary | ICD-10-CM | POA: Insufficient documentation

## 2014-02-04 DIAGNOSIS — Z880 Allergy status to penicillin: Secondary | ICD-10-CM | POA: Insufficient documentation

## 2014-02-04 DIAGNOSIS — R296 Repeated falls: Secondary | ICD-10-CM | POA: Insufficient documentation

## 2014-02-04 DIAGNOSIS — R58 Hemorrhage, not elsewhere classified: Secondary | ICD-10-CM

## 2014-02-04 DIAGNOSIS — Z7982 Long term (current) use of aspirin: Secondary | ICD-10-CM | POA: Insufficient documentation

## 2014-02-04 DIAGNOSIS — E039 Hypothyroidism, unspecified: Secondary | ICD-10-CM | POA: Insufficient documentation

## 2014-02-04 DIAGNOSIS — Z853 Personal history of malignant neoplasm of breast: Secondary | ICD-10-CM | POA: Insufficient documentation

## 2014-02-04 DIAGNOSIS — J4489 Other specified chronic obstructive pulmonary disease: Secondary | ICD-10-CM | POA: Insufficient documentation

## 2014-02-04 DIAGNOSIS — M1990 Unspecified osteoarthritis, unspecified site: Secondary | ICD-10-CM | POA: Insufficient documentation

## 2014-02-04 DIAGNOSIS — F411 Generalized anxiety disorder: Secondary | ICD-10-CM | POA: Insufficient documentation

## 2014-02-04 DIAGNOSIS — Z792 Long term (current) use of antibiotics: Secondary | ICD-10-CM | POA: Insufficient documentation

## 2014-02-04 DIAGNOSIS — Y9389 Activity, other specified: Secondary | ICD-10-CM | POA: Insufficient documentation

## 2014-02-04 DIAGNOSIS — Z85828 Personal history of other malignant neoplasm of skin: Secondary | ICD-10-CM | POA: Insufficient documentation

## 2014-02-04 DIAGNOSIS — I129 Hypertensive chronic kidney disease with stage 1 through stage 4 chronic kidney disease, or unspecified chronic kidney disease: Secondary | ICD-10-CM | POA: Insufficient documentation

## 2014-02-04 DIAGNOSIS — Y929 Unspecified place or not applicable: Secondary | ICD-10-CM | POA: Insufficient documentation

## 2014-02-04 DIAGNOSIS — I998 Other disorder of circulatory system: Secondary | ICD-10-CM | POA: Insufficient documentation

## 2014-02-04 DIAGNOSIS — Z79899 Other long term (current) drug therapy: Secondary | ICD-10-CM | POA: Insufficient documentation

## 2014-02-04 DIAGNOSIS — J449 Chronic obstructive pulmonary disease, unspecified: Secondary | ICD-10-CM | POA: Insufficient documentation

## 2014-02-04 DIAGNOSIS — N184 Chronic kidney disease, stage 4 (severe): Secondary | ICD-10-CM | POA: Insufficient documentation

## 2014-02-04 DIAGNOSIS — F329 Major depressive disorder, single episode, unspecified: Secondary | ICD-10-CM | POA: Insufficient documentation

## 2014-02-04 LAB — CBC WITH DIFFERENTIAL/PLATELET
Basophils Absolute: 0 10*3/uL (ref 0.0–0.1)
Basophils Relative: 0 % (ref 0–1)
EOS ABS: 0.3 10*3/uL (ref 0.0–0.7)
Eosinophils Relative: 3 % (ref 0–5)
HCT: 37.5 % (ref 36.0–46.0)
Hemoglobin: 11.9 g/dL — ABNORMAL LOW (ref 12.0–15.0)
LYMPHS ABS: 1.8 10*3/uL (ref 0.7–4.0)
LYMPHS PCT: 17 % (ref 12–46)
MCH: 29.4 pg (ref 26.0–34.0)
MCHC: 31.7 g/dL (ref 30.0–36.0)
MCV: 92.6 fL (ref 78.0–100.0)
Monocytes Absolute: 1.3 10*3/uL — ABNORMAL HIGH (ref 0.1–1.0)
Monocytes Relative: 12 % (ref 3–12)
NEUTROS PCT: 68 % (ref 43–77)
Neutro Abs: 7.4 10*3/uL (ref 1.7–7.7)
PLATELETS: 157 10*3/uL (ref 150–400)
RBC: 4.05 MIL/uL (ref 3.87–5.11)
RDW: 14.3 % (ref 11.5–15.5)
WBC: 10.9 10*3/uL — AB (ref 4.0–10.5)

## 2014-02-04 NOTE — ED Notes (Signed)
EMS reports pt a resident of [facility] - EMS states patient fell on 6/22 and had a skin tear to right upper arm - discoloration noted to right upper arm x3 days. Family would like arm assessed.

## 2014-02-04 NOTE — ED Provider Notes (Signed)
CSN: [id number]     Arrival date & time 02/04/14  0019 History  This chart was scribed for Claudia Alfonso Patten, MD by Roxan Diesel, ED scribe.  This patient was seen in room MH05/MH05 and the patient's care was started at 12:47 AM.   Chief Complaint  Patient presents with  . Arm Injury    Patient is a 80 y.o. female presenting with arm injury. The history is provided by the EMS personnel and a relative. No language interpreter was used.  Arm Injury Location:  Arm Time since incident:  1 week Injury: yes   Mechanism of injury: fall   Fall:    Impact surface:  Hard floor   Point of impact: arm. Arm location:  R upper arm Pain details:    Severity:  No pain Chronicity:  New Dislocation: no   Foreign body present:  No foreign bodies Relieved by:  Nothing Associated symptoms: no numbness, no stiffness, no swelling and no tingling   Associated symptoms comment:  Skin tear and bruising Risk factors comment:  Aspirin   Level 5 Caveat: Dementia  HPI Comments: Claudia [name] is a 80 y.o. female brought in by EMS to the Emergency Department complaining of a right arm injury sustained in a fall 1 week ago.  Pt has dementia and is a resident of [facility].  Daughter states that pt fell and sustained a skin tear to the back of her right upper arm.  She received wound care at nursing home.  However tonight daughter was called and informed that the bruising was spreading down her arm.  Pt is on aspirin.   Past Medical History  Diagnosis Date  . COPD (chronic obstructive pulmonary disease)   . Hyperlipidemia   . Hypertension   . Dementia   . Osteopenia     last dexa aprox 2008- per daughter  . Depression   . Peripheral vascular disease     Diminished circulation in both legs  . Pneumonia     "not in awhile" (04/12/2013)  . Exertional shortness of breath   . Hypothyroidism   . History of stomach ulcers 1990's  . Sinus headache     "monthly or less" (04/12/2013)   . Stroke     "she's had many TIAs in the past"  (04/12/2013)  . Osteoarthritis     "back and joints" (04/12/2013)  . Anxiety   . Renal insufficiency   . Chronic kidney disease (CKD), stage IV (severe)     "her numbers have been consistently stable for years and years" (04/12/2013)  . Breast cancer   . Skin cancer of face     "some cut off; some burned off" (04/12/2013)    Past Surgical History  Procedure Laterality Date  . Appendectomy    . Tonsillectomy    . Hip pinning,cannulated  10/28/2011    Procedure: CANNULATED HIP PINNING;  Surgeon: Nita Sells, MD;  Location: [facility];  Service: Orthopedics;  Laterality: Left;  . Cataract extraction w/ intraocular lens  implant, bilateral Bilateral 1990's  . Refractive surgery Bilateral 1990's    "the kind that sometimes follows cataract OR" (04/12/2013)  . Skin cancer excision  1990's    "face" (04/12/2013)  . Breast lumpectomy Right ~ 2006  . Abdominal hysterectomy  ~ 1974    partial  . Bilateral oophorectomy Bilateral 2000's    "couple years apart" (04/12/2013)    History reviewed. No pertinent family history.   History  Substance  Use Topics  . Smoking status: Former Smoker -- 1.00 packs/day for 50 years    Types: Cigarettes  . Smokeless tobacco: Never Used     Comment: 04/12/2013 "probably stopped smoking early 2000's"  . Alcohol Use: Yes     Comment: 04/12/2013 "no alcohol in 10-12 yr"    OB History   Grav Para Term Preterm Abortions TAB SAB Ect Mult Living                   Review of Systems  Musculoskeletal: Negative for stiffness.  All other systems reviewed and are negative.     Allergies  Amoxicillin-pot clavulanate; Lactose intolerance (gi); and Sulfonamide derivatives  Home Medications   Prior to Admission medications   Medication Sig Start Date End Date Taking? Authorizing Provider  acetaminophen (TYLENOL) 500 MG tablet Take 500 mg by mouth 3 (three) times daily.    Yes Historical Provider, MD   amLODipine (NORVASC) 10 MG tablet Take 10 mg by mouth daily.   Yes Historical Provider, MD  aspirin 81 MG tablet Take 81 mg by mouth every other day.    Yes Historical Provider, MD  busPIRone (BUSPAR) 10 MG tablet Take 10 mg by mouth 2 (two) times daily.   Yes Historical Provider, MD  Calcium-Magnesium-Vitamin D (CALCIUM 500 PO) Take by mouth.   Yes Historical Provider, MD  cholecalciferol (VITAMIN D) 1000 UNITS tablet Take 400 Units by mouth daily.    Yes Historical Provider, MD  donepezil (ARICEPT) 10 MG tablet Take 10 mg by mouth at bedtime.     Yes Historical Provider, MD  doxycycline (VIBRAMYCIN) 50 MG capsule Take 50 mg by mouth 2 (two) times daily.   Yes Historical Provider, MD  ferrous gluconate (FERGON) 325 MG tablet Take 325 mg by mouth daily with breakfast.   Yes Historical Provider, MD  lactobacillus rhamnosus, GG, (CULTURELLE) 10 B CELL capsule Take 1 capsule by mouth daily.     Yes Historical Provider, MD  levothyroxine (SYNTHROID, LEVOTHROID) 75 MCG tablet Take 125 mcg by mouth daily before breakfast.    Yes Historical Provider, MD  Loperamide-Simethicone (IMODIUM ADVANCED) 2-125 MG TABS Take 1 tablet by mouth as needed (loose stoole). No more than 3/day   Yes Historical Provider, MD  LORazepam (ATIVAN) 0.5 MG tablet Take 0.5 mg by mouth every 6 (six) hours as needed for anxiety.   Yes Historical Provider, MD  LORazepam (ATIVAN) 0.5 MG tablet Take 0.5 mg by mouth 3 (three) times daily. 04/13/13  Yes Lesly Dukes, MD  Multiple Vitamins-Minerals (CENTRUM ULTRA WOMENS PO) Take by mouth.     Yes Historical Provider, MD  nystatin (MYCOSTATIN/NYSTOP) 100000 UNIT/GM POWD Apply 1 Bottle topically 3 (three) times daily. Please apply to candida rash on left inguinal area. 04/13/13  Yes Lesly Dukes, MD  Probiotic Product (PROBIOTIC DAILY PO) Take by mouth.   Yes Historical Provider, MD  sertraline (ZOLOFT) 100 MG tablet Take 100 mg by mouth 2 (two) times daily. PT takes 150mg  PO QAM   Yes Historical  Provider, MD  traMADol (ULTRAM) 50 MG tablet Take 1 tablet (50 mg total) by mouth every 6 (six) hours as needed for pain. Patient is given this medication for pain. 04/13/13  Yes Lesly Dukes, MD  cetirizine (ZYRTEC) 10 MG tablet Take 10 mg by mouth daily as needed for allergies.     Historical Provider, MD   BP 149/58  Pulse 85  Temp(Src) 98.6 F (37 C) (Oral)  Resp 18  Ht  5\' 5"  (1.651 m)  Wt 110 lb (49.896 kg)  BMI 18.31 kg/m2  SpO2 94%  Physical Exam  Nursing note and vitals reviewed. Constitutional: She appears well-developed and well-nourished. No distress.  HENT:  Head: Normocephalic and atraumatic.  Mouth/Throat: Oropharynx is clear and moist.  Eyes: Conjunctivae and EOM are normal. Pupils are equal, round, and reactive to light.  Bilateral lens implants from previous cataract surgery, done after the year 2001.  Neck: Normal range of motion. Neck supple. No tracheal deviation present.  Cardiovascular: Normal rate and regular rhythm.   Pulmonary/Chest: Effort normal and breath sounds normal. No respiratory distress. She has no wheezes. She has no rales.  Abdominal: Soft. Bowel sounds are normal. There is no rebound and no guarding.  Musculoskeletal: Normal range of motion. She exhibits no edema and no tenderness.  Healing skin tear on right lateral elbow.  Patchy ecchymoses at posterior upper arm.  No signs of compartment syndrome.  No warmth, erythema, or fluctuance.   No snuff box tenderness.  No effusion of elbow.  Radial pulse 2+.    No hematoma.  No palpable cortical irregularity.  Shoulder appropriately seated.  Great DTR.  Clavicle intact.  Neurological: She is alert. She has normal reflexes.  Right upper extremity neurovascularly intact.  Skin: Skin is warm and dry.  Psychiatric: She has a normal mood and affect. Her behavior is normal.    ED Course  Procedures (including critical care time)  DIAGNOSTIC STUDIES: Oxygen Saturation is 94% on room air, adequate by my  interpretation.    COORDINATION OF CARE: 12:53 AM-Discussed treatment plan which includes x-ray with pt at bedside and pt agreed to plan.     Labs Review Labs Reviewed  CBC WITH DIFFERENTIAL    Imaging Review No results found.   EKG Interpretation None      MDM   Final diagnoses:  None   No fracture, no signs of infection or compartment syndrome.   Ecchymosis of the posterior RUE resolving, likely secondary to pulling off and on of adhesive dressing.  Please use non adherent dressing and neosporin daily ice and elevated the RUE    I personally performed the services described in this documentation, which was scribed in my presence. The recorded information has been reviewed and is accurate.    Carlisle Beers, MD 02/04/14 [phone]

## 2014-02-04 NOTE — ED Notes (Signed)
I tried to help patient to bathroom from bed. I used the "Stedy"  Patient was unable to urinate after much effort. Patient was unwilling to use bedpan or bedside toilet.

## 2014-02-04 NOTE — ED Notes (Signed)
Report to [place], RN, to assume care of patient at this time. PTAR on the way to transport pt back to facility.

## 2015-06-10 DEATH — deceased
